# Patient Record
Sex: Male | Born: 1990 | Race: White | Hispanic: No | Marital: Single | Smoking: Current every day smoker
Health system: Southern US, Community
[De-identification: ages and names within clinical notes are randomized; demographics above are authoritative.]

---
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
## 2016-04-03 ENCOUNTER — Encounter: Payer: Self-pay | Admitting: Medical Oncology

## 2016-04-03 ENCOUNTER — Emergency Department: Payer: BLUE CROSS/BLUE SHIELD

## 2016-04-03 ENCOUNTER — Emergency Department
Admission: EM | Admit: 2016-04-03 | Discharge: 2016-04-03 | Disposition: A | Discharge status: Home / Self Care | Payer: BLUE CROSS/BLUE SHIELD | Attending: Emergency Medicine | Admitting: Emergency Medicine

## 2016-04-03 DIAGNOSIS — R1030 Lower abdominal pain, unspecified: Secondary | ICD-10-CM | POA: Diagnosis not present

## 2016-04-03 DIAGNOSIS — R109 Unspecified abdominal pain: Secondary | ICD-10-CM

## 2016-04-03 LAB — COMPREHENSIVE METABOLIC PANEL
ALBUMIN: 4.4 g/dL (ref 3.5–5.0)
ALK PHOS: 78 U/L (ref 38–126)
ALT: 17 U/L (ref 17–63)
AST: 25 U/L (ref 15–41)
Anion gap: 7 (ref 5–15)
BILIRUBIN TOTAL: 0.9 mg/dL (ref 0.3–1.2)
BUN: 10 mg/dL (ref 6–20)
CALCIUM: 8.9 mg/dL (ref 8.9–10.3)
CO2: 29 mmol/L (ref 22–32)
CREATININE: 0.85 mg/dL (ref 0.61–1.24)
Chloride: 102 mmol/L (ref 101–111)
GFR calc Af Amer: >60 mL/min (ref >60)
GLUCOSE: 91 mg/dL (ref 65–99)
Potassium: 3.5 mmol/L (ref 3.5–5.1)
Sodium: 138 mmol/L (ref 135–145)
TOTAL PROTEIN: 7.7 g/dL (ref 6.5–8.1)

## 2016-04-03 LAB — URINALYSIS, COMPLETE (UACMP) WITH MICROSCOPIC
BACTERIA UA: NONE SEEN
BILIRUBIN URINE: NEGATIVE
Glucose, UA: NEGATIVE mg/dL
Hgb urine dipstick: NEGATIVE
Ketones, ur: NEGATIVE mg/dL
LEUKOCYTES UA: NEGATIVE
NITRITE: NEGATIVE
PH: 6 (ref 5.0–8.0)
Protein, ur: NEGATIVE mg/dL
RBC / HPF: NONE SEEN RBC/hpf (ref 0–5)
SPECIFIC GRAVITY, URINE: 1.025 (ref 1.005–1.030)

## 2016-04-03 LAB — LIPASE, BLOOD: Lipase: 21 U/L (ref 11–51)

## 2016-04-03 LAB — CBC
HCT: 43.9 % (ref 40.0–52.0)
Hemoglobin: 14.9 g/dL (ref 13.0–18.0)
MCH: 27.9 pg (ref 26.0–34.0)
MCHC: 33.9 g/dL (ref 32.0–36.0)
MCV: 82.4 fL (ref 80.0–100.0)
PLATELETS: 163 10*3/uL (ref 150–440)
RBC: 5.32 MIL/uL (ref 4.40–5.90)
RDW: 13.7 % (ref 11.5–14.5)
WBC: 7.8 10*3/uL (ref 3.8–10.6)

## 2016-04-03 MED ORDER — IOPAMIDOL (ISOVUE-300) INJECTION 61%
30.0000 mL | Freq: Once | INTRAVENOUS | Status: AC
  Administered 2016-04-03: 30 mL via ORAL
  Filled 2016-04-03: qty 30

## 2016-04-03 MED ORDER — IOPAMIDOL (ISOVUE-300) INJECTION 61%
100.0000 mL | Freq: Once | INTRAVENOUS | Status: AC | PRN
  Administered 2016-04-03: 100 mL via INTRAVENOUS
  Filled 2016-04-03: qty 100

## 2016-04-03 MED ORDER — HYDROCODONE-ACETAMINOPHEN 5-325 MG PO TABS
2.0000 | ORAL_TABLET | Freq: Once | ORAL | Status: AC
  Administered 2016-04-03: 2 via ORAL

## 2016-04-03 MED ORDER — ONDANSETRON HCL 4 MG/2ML IJ SOLN
4.0000 mg | Freq: Once | INTRAMUSCULAR | Status: AC
  Administered 2016-04-03: 4 mg via INTRAVENOUS
  Filled 2016-04-03: qty 2

## 2016-04-03 MED ORDER — HYDROCODONE-ACETAMINOPHEN 5-325 MG PO TABS
ORAL_TABLET | ORAL | Status: AC
  Administered 2016-04-03: 2 via ORAL
  Filled 2016-04-03: qty 2

## 2016-04-03 MED ORDER — HYDROCODONE-ACETAMINOPHEN 5-325 MG PO TABS: 1.0000 | ORAL_TABLET | ORAL | 0 refills | Status: DC | PRN

## 2016-04-03 MED ORDER — MORPHINE SULFATE (PF) 4 MG/ML IV SOLN
4.0000 mg | Freq: Once | INTRAVENOUS | Status: AC
  Administered 2016-04-03: 4 mg via INTRAVENOUS
  Filled 2016-04-03: qty 1

## 2016-04-03 NOTE — ED Notes (Signed)
Pt to ed with c/o abd pain that began this am after getting up, denies n/v/d. Reports more painful when he stands straight up. Pt states he feels better when he is sitting up.  Pt skin warm and dry.  Pt alert and oriented and jovial.

## 2016-04-03 NOTE — ED Triage Notes (Signed)
Pt reports abd pain that began this am, denies nvd. Reports more painful when he stands straight up.

## 2016-04-03 NOTE — ED Provider Notes (Signed)
[facility] Emergency Department Provider Note  Time seen: 3:24 PM  I have reviewed the triage vital signs and the nursing notes.   HISTORY  Chief Complaint Abdominal Pain    HPI Sean Vega is a 26 y.o. male with no past medical history who presents the emergency department for abdominal pain. According to the patient since 2:30 this morning he has been experiencing lower abdominal pain. States it hurts anytime he attempts to stand straight or stretches abdomen. States it hurts if he pushes in his lower abdomen. Denies any nausea, vomiting, diarrhea. States a normal bowel movement approximately 30 minutes ago. Denies any fever. Denies any abdominal surgeries in the past.  History reviewed. No pertinent past medical history.  There are no active problems to display for this patient.   History reviewed. No pertinent surgical history.  Prior to Admission medications   Not on File    No Known Allergies  No family history on file.  Social History Social History  Substance Use Topics  . Smoking status: Not on file  . Smokeless tobacco: Not on file  . Alcohol use Not on file    Review of Systems Constitutional: Negative for fever. Cardiovascular: Negative for chest pain. Respiratory: Negative for shortness of breath. Gastrointestinal: Positive for lower abdominal pain. Negative for nausea, vomiting, diarrhea. Genitourinary: Negative for dysuria. Musculoskeletal: Negative for back pain Neurological: Negative for headache 10-point ROS otherwise negative.  ____________________________________________   PHYSICAL EXAM:  VITAL SIGNS: ED Triage Vitals [04/03/16 1329]  Enc Vitals Group     BP (!) 141/93     Pulse Rate 81     Resp 16     Temp 97.7 F (36.5 C)     Temp Source Oral     SpO2 100 %     Weight 135 lb (61.2 kg)     Height 5\' 3"  (1.6 m)     Head Circumference      Peak Flow      Pain Score 8     Pain Loc      Pain Edu?       Excl. in GC?     Constitutional: Alert and oriented. Well appearing and in no distress. Eyes: Normal exam ENT   Head: Normocephalic and atraumatic.   Mouth/Throat: Mucous membranes are moist. Cardiovascular: Normal rate, regular rhythm. No murmur Respiratory: Normal respiratory effort without tachypnea nor retractions. Breath sounds are clear  Gastrointestinal: , Fairly diffuse lower abdominal voluntary guarding on exam, mild diffuse lower abdominal tenderness to palpation. No rebound. No CVA tenderness. Musculoskeletal: Nontender with normal range of motion in all extremities.  Neurologic:  Normal speech and language. No gross focal neurologic deficits Skin:  Skin is warm, dry and intact.  Psychiatric: Mood and affect are normal.   ____________________________________________   RADIOLOGY  CT negative  ____________________________________________   INITIAL IMPRESSION / ASSESSMENT AND PLAN / ED COURSE  Pertinent labs & imaging results that were available during my care of the patient were reviewed by me and considered in my medical decision making (see chart for details).  Patient presents the emergency department for lower abdominal pain since 2:30 this morning. Patient has voluntary guarding of the lower abdomen, making palpation quite difficult. Appears to be somewhat tender in the lower abdomen. Given his guarding and complaint of increased pain with any abdominal stretching we will obtain a CT scan of the abdomen and pelvis to rule out appendicitis.  CT scan of abdomen is  negative. Labs are within normal limits. Highly suspect muscular injury. Patient will be discharged with a short course of pain medication. Patient agreeable to plan.  ____________________________________________   FINAL CLINICAL IMPRESSION(S) / ED DIAGNOSES  Abdominal pain    Minna AntisKevin Emorie Mcfate, MD 04/03/16 [phone]

## 2016-06-28 ENCOUNTER — Ambulatory Visit: Payer: Self-pay | Admitting: Family Medicine

## 2016-08-09 ENCOUNTER — Ambulatory Visit: Payer: Self-pay | Admitting: Family Medicine

## 2016-10-18 ENCOUNTER — Ambulatory Visit: Payer: Self-pay | Admitting: Family Medicine

## 2016-11-22 ENCOUNTER — Ambulatory Visit (INDEPENDENT_AMBULATORY_CARE_PROVIDER_SITE_OTHER): Payer: BLUE CROSS/BLUE SHIELD | Admitting: Family Medicine

## 2016-11-22 ENCOUNTER — Encounter: Payer: Self-pay | Admitting: Family Medicine

## 2016-11-22 VITALS — BP 129/84 | HR 59 | Temp 98.0°F | Resp 16 | Ht 63.5 in | Wt 137.0 lb

## 2016-11-22 DIAGNOSIS — Z13220 Encounter for screening for lipoid disorders: Secondary | ICD-10-CM

## 2016-11-22 DIAGNOSIS — F321 Major depressive disorder, single episode, moderate: Secondary | ICD-10-CM

## 2016-11-22 DIAGNOSIS — Z23 Encounter for immunization: Secondary | ICD-10-CM

## 2016-11-22 DIAGNOSIS — R0602 Shortness of breath: Secondary | ICD-10-CM

## 2016-11-22 DIAGNOSIS — Z113 Encounter for screening for infections with a predominantly sexual mode of transmission: Secondary | ICD-10-CM

## 2016-11-22 DIAGNOSIS — M25562 Pain in left knee: Secondary | ICD-10-CM | POA: Diagnosis not present

## 2016-11-22 DIAGNOSIS — F325 Major depressive disorder, single episode, in full remission: Secondary | ICD-10-CM | POA: Insufficient documentation

## 2016-11-22 DIAGNOSIS — M25561 Pain in right knee: Secondary | ICD-10-CM

## 2016-11-22 DIAGNOSIS — Z789 Other specified health status: Secondary | ICD-10-CM

## 2016-11-22 DIAGNOSIS — Z9189 Other specified personal risk factors, not elsewhere classified: Secondary | ICD-10-CM

## 2016-11-22 DIAGNOSIS — G8929 Other chronic pain: Secondary | ICD-10-CM

## 2016-11-22 MED ORDER — FLUOXETINE HCL 20 MG PO CAPS: 20.0000 mg | ORAL_CAPSULE | Freq: Every day | ORAL | 3 refills | Status: DC

## 2016-11-22 MED ORDER — NAPROXEN 500 MG PO TABS
500.0000 mg | ORAL_TABLET | Freq: Two times a day (BID) | ORAL | 2 refills | Status: DC
Start: 2016-11-22 — End: 2017-04-25

## 2016-11-22 NOTE — Assessment & Plan Note (Signed)
Not under good control. Will start prozac. Recheck 2 weeks. Call with any concerns.

## 2016-11-22 NOTE — Progress Notes (Signed)
BP 129/84 (BP Location: Left Arm, Patient Position: Sitting, Cuff Size: Normal)   Pulse (!) 59   Temp 98 F (36.7 C) (Oral)   Resp 16   Ht 5' 3.5" (1.613 m)   Wt 137 lb (62.1 kg)   BMI 23.89 kg/m    Subjective:    Patient ID: Sean Vega, male    DOB: [birth year], 26 y.o.   MRN: [id number]  HPI: Sean Vega is a 26 y.o. male  Chief Complaint  Patient presents with  . Establish Care    Pain in rt knee X53mos. Achy all over off and on   KNEE PAIN- happened when he was a kid, but then got better, then started to hurt again Duration: Months Involved knee: both, R>L Mechanism of injury: unknown Location:anterior Onset: sudden Severity: 7/10  Quality:  aching Frequency: intermittent, with flexion and running Radiation: no Aggravating factors: running and bending  Alleviating factors: horse linement and NSAIDs  Status: worse Treatments attempted: ibuprofen  Relief with NSAIDs?:  moderate Weakness with weight bearing or walking: no Sensation of giving way: no Locking: no Popping: yes Bruising: no Swelling: no Redness: no Paresthesias/decreased sensation: no Fevers: no   SHORTNESS OF BREATH Duration: January/February Onset: gradual Description of breathing discomfort: loud breathing, wheezing Severity: mild Episode duration: unknown Frequency: daily Related to exertion: yes Cough: no Chest tightness: no Wheezing: yes Fevers: no Chest pain: no Palpitations: no  Nausea: no Diaphoresis: yes Deconditioning: no Status: worse  DEPRESSION- started when he was in prision. Has gotten worse since he got out. Not feeling like himself.  Mood status: exacerbated Satisfied with current treatment?: no Symptom severity: moderate  Duration of current treatment : Not on anything Side effects: no Medication compliance: excellent compliance Psychotherapy/counseling: no  Previous psychiatric medications: none Depressed mood: yes Anxious mood:  no Anhedonia: no Significant weight loss or gain: no Insomnia: no  Fatigue: yes Feelings of worthlessness or guilt: yes Impaired concentration/indecisiveness: yes Suicidal ideations: no Hopelessness: no Crying spells: no Depression screen PHQ 2/9 11/22/2016  Decreased Interest 0  Down, Depressed, Hopeless 3  PHQ - 2 Score 3  Altered sleeping 0  Tired, decreased energy 3  Change in appetite 3  Feeling bad or failure about yourself  2  Trouble concentrating 3  Moving slowly or fidgety/restless 3  Suicidal thoughts 0  PHQ-9 Score 17  Difficult doing work/chores Very difficult     Active Ambulatory Problems    Diagnosis Date Noted  . Depression, major, single episode, moderate (HCC) 11/22/2016  . Arthralgia of both knees 11/22/2016   Resolved Ambulatory Problems    Diagnosis Date Noted  . Routine screening for STI (sexually transmitted infection) 11/22/2016   No Additional Past Medical History   No past surgical history on file.  Outpatient Encounter Prescriptions as of 11/22/2016  Medication Sig  . [DISCONTINUED] ibuprofen (ADVIL,MOTRIN) 200 MG tablet Take 200 mg by mouth every 8 (eight) hours as needed.  Marland Kitchen FLUoxetine (PROZAC) 20 MG capsule Take 1 capsule (20 mg total) by mouth daily.  . naproxen (NAPROSYN) 500 MG tablet Take 1 tablet (500 mg total) by mouth 2 (two) times daily with a meal.  . [DISCONTINUED] HYDROcodone-acetaminophen (NORCO/VICODIN) 5-325 MG tablet Take 1 tablet by mouth every 4 (four) hours as needed.   No facility-administered encounter medications on file as of 11/22/2016.    No Known Allergies  Family History  Problem Relation Age of Onset  . Lung disease Maternal Grandmother  Social History   Social History  . Marital status: Single    Spouse name: N/A  . Number of children: N/A  . Years of education: N/A   Occupational History  . Not on file.   Social History Main Topics  . Smoking status: Current Every Day Smoker  . Smokeless  tobacco: Never Used  . Alcohol use Not on file     Comment: 10 per week  . Drug use: No  . Sexual activity: Not on file   Other Topics Concern  . Not on file   Social History Narrative  . No narrative on file     Review of Systems  Constitutional: Negative.   Respiratory: Positive for shortness of breath. Negative for apnea, cough, choking, chest tightness, wheezing and stridor.   Cardiovascular: Negative.   Musculoskeletal: Positive for arthralgias, gait problem and joint swelling. Negative for back pain, myalgias, neck pain and neck stiffness.  Psychiatric/Behavioral: Positive for dysphoric mood. Negative for agitation, behavioral problems, confusion, decreased concentration, hallucinations, self-injury, sleep disturbance and suicidal ideas. The patient is not nervous/anxious and is not hyperactive.     Per HPI unless specifically indicated above     Objective:    BP 129/84 (BP Location: Left Arm, Patient Position: Sitting, Cuff Size: Normal)   Pulse (!) 59   Temp 98 F (36.7 C) (Oral)   Resp 16   Ht 5' 3.5" (1.613 m)   Wt 137 lb (62.1 kg)   BMI 23.89 kg/m   Wt Readings from Last 3 Encounters:  11/22/16 137 lb (62.1 kg)  04/03/16 135 lb (61.2 kg)    Physical Exam  Constitutional: He is oriented to person, place, and time. He appears well-developed and well-nourished. No distress.  HENT:  Head: Normocephalic and atraumatic.  Right Ear: Hearing normal.  Left Ear: Hearing normal.  Nose: Nose normal.  Eyes: Conjunctivae and lids are normal. Right eye exhibits no discharge. Left eye exhibits no discharge. No scleral icterus.  Cardiovascular: Normal rate, regular rhythm, normal heart sounds and intact distal pulses.  Exam reveals no gallop and no friction rub.   No murmur heard. Pulmonary/Chest: Effort normal and breath sounds normal. No respiratory distress. He has no wheezes. He has no rales. He exhibits no tenderness.  Musculoskeletal: Normal range of motion. He  exhibits tenderness (patellar tendon bilaterally). He exhibits no edema or deformity.  Negative Apply's compression and distraction, negative mcmurrays, negative lachmans bilaterally   Neurological: He is alert and oriented to person, place, and time.  Skin: Skin is warm, dry and intact. No rash noted. He is not diaphoretic. No erythema. No pallor.  Psychiatric: He has a normal mood and affect. His speech is normal and behavior is normal. Judgment and thought content normal. Cognition and memory are normal.  Nursing note and vitals reviewed.   Results for orders placed or performed during the hospital encounter of 04/03/16  Lipase, blood  Result Value Ref Range   Lipase 21 11 - 51 U/L  Comprehensive metabolic panel  Result Value Ref Range   Sodium 138 135 - 145 mmol/L   Potassium 3.5 3.5 - 5.1 mmol/L   Chloride 102 101 - 111 mmol/L   CO2 29 22 - 32 mmol/L   Glucose, Bld 91 65 - 99 mg/dL   BUN 10 6 - 20 mg/dL   Creatinine, Ser 1.19 0.61 - 1.24 mg/dL   Calcium 8.9 8.9 - 41.7 mg/dL   Total Protein 7.7 6.5 - 8.1 g/dL   Albumin  4.4 3.5 - 5.0 g/dL   AST 25 15 - 41 U/L   ALT 17 17 - 63 U/L   Alkaline Phosphatase 78 38 - 126 U/L   Total Bilirubin 0.9 0.3 - 1.2 mg/dL   GFR calc non Af Amer >60 >60 mL/min   GFR calc Af Amer >60 >60 mL/min   Anion gap 7 5 - 15  CBC  Result Value Ref Range   WBC 7.8 3.8 - 10.6 K/uL   RBC 5.32 4.40 - 5.90 MIL/uL   Hemoglobin 14.9 13.0 - 18.0 g/dL   HCT 16.1 09.6 - 04.5 %   MCV 82.4 80.0 - 100.0 fL   MCH 27.9 26.0 - 34.0 pg   MCHC 33.9 32.0 - 36.0 g/dL   RDW 40.9 81.1 - 91.4 %   Platelets 163 150 - 440 K/uL  Urinalysis, Complete w Microscopic  Result Value Ref Range   Color, Urine YELLOW (A) YELLOW   APPearance CLEAR (A) CLEAR   Specific Gravity, Urine 1.025 1.005 - 1.030   pH 6.0 5.0 - 8.0   Glucose, UA NEGATIVE NEGATIVE mg/dL   Hgb urine dipstick NEGATIVE NEGATIVE   Bilirubin Urine NEGATIVE NEGATIVE   Ketones, ur NEGATIVE NEGATIVE mg/dL    Protein, ur NEGATIVE NEGATIVE mg/dL   Nitrite NEGATIVE NEGATIVE   Leukocytes, UA NEGATIVE NEGATIVE   RBC / HPF NONE SEEN 0 - 5 RBC/hpf   WBC, UA 0-5 0 - 5 WBC/hpf   Bacteria, UA NONE SEEN NONE SEEN   Squamous Epithelial / LPF 0-5 (A) NONE SEEN   Mucus PRESENT       Assessment & Plan:   Problem List Items Addressed This Visit      Other   Depression, major, single episode, moderate (HCC)    Not under good control. Will start prozac. Recheck 2 weeks. Call with any concerns.       Relevant Medications   FLUoxetine (PROZAC) 20 MG capsule   Other Relevant Orders   CBC with Differential/Platelet   Comprehensive metabolic panel   TSH   Arthralgia of both knees    Likely jumper's knee. Rx for meloxicam given. Exercises given. Will check x-ray of R knee given severity. Call with any concerns. Recheck 1 month.       Relevant Orders   Comprehensive metabolic panel   Uric acid   Lyme Ab/Western Blot Reflex   Rocky mtn spotted fvr abs pnl(IgG+IgM)   Ehrlichia Antibody Panel   Babesia microti Antibody Panel   RESOLVED: Routine screening for STI (sexually transmitted infection)    Labs drawn today. Await results.       Relevant Orders   UA/M w/rflx Culture, Routine   HIV antibody   RPR   HSV(herpes simplex vrs) 1+2 ab-IgG   GC/Chlamydia Probe Amp   Hepatitis, Acute    Other Visit Diagnoses    SOB (shortness of breath)    -  Primary   Spirometry normal. Not feeling particulatrly bad. Reassured patient. Continue to monitor.    Relevant Orders   CBC with Differential/Platelet   Comprehensive metabolic panel   Spirometry with Graph (Completed)   Need for Tdap vaccination       Vaccine given today.    History of incarceration       Will check quantiferon and HIV. Ordered today.   Relevant Orders   Quantiferon tb gold assay (blood)   Screening for cholesterol level       Labs drawn today. Await results.  Relevant Orders   Lipid Panel w/o Chol/HDL Ratio   Chronic pain of  right knee       Likely jumper's knee. Will obtain x-ray. Order placed today.   Relevant Orders   DG Knee Complete 4 Views Right       Follow up plan: Return in about 2 weeks (around 12/06/2016) for follow up knees and mood.

## 2016-11-22 NOTE — Assessment & Plan Note (Signed)
Likely jumper's knee. Rx for meloxicam given. Exercises given. Will check x-ray of R knee given severity. Call with any concerns. Recheck 1 month.

## 2016-11-22 NOTE — Patient Instructions (Addendum)
Tdap Vaccine (Tetanus, Diphtheria and Pertussis): What You Need to Know 1. Why get vaccinated? Tetanus, diphtheria and pertussis are very serious diseases. Tdap vaccine can protect us from these diseases. And, Tdap vaccine given to pregnant women can protect newborn babies against pertussis. TETANUS (Lockjaw) is rare in the [place] today. It causes painful muscle tightening and stiffness, usually all over the body.  It can lead to tightening of muscles in the head and neck so you can't open your mouth, swallow, or sometimes even breathe. Tetanus kills about 1 out of 10 people who are infected even after receiving the best medical care.  DIPHTHERIA is also rare in the [place] today. It can cause a thick coating to form in the back of the throat.  It can lead to breathing problems, heart failure, paralysis, and death.  PERTUSSIS (Whooping Cough) causes severe coughing spells, which can cause difficulty breathing, vomiting and disturbed sleep.  It can also lead to weight loss, incontinence, and rib fractures. Up to 2 in 100 adolescents and 5 in 100 adults with pertussis are hospitalized or have complications, which could include pneumonia or death.  These diseases are caused by bacteria. Diphtheria and pertussis are spread from person to person through secretions from coughing or sneezing. Tetanus enters the body through cuts, scratches, or wounds. Before vaccines, as many as 200,000 cases of diphtheria, 200,000 cases of pertussis, and hundreds of cases of tetanus, were reported in the [place] each year. Since vaccination began, reports of cases for tetanus and diphtheria have dropped by about 99% and for pertussis by about 80%. 2. Tdap vaccine Tdap vaccine can protect adolescents and adults from tetanus, diphtheria, and pertussis. One dose of Tdap is routinely given at age 11 or 12. People who did not get Tdap at that age should get it as soon as possible. Tdap is especially  important for healthcare professionals and anyone having close contact with a baby younger than 12 months. Pregnant women should get a dose of Tdap during every pregnancy, to protect the newborn from pertussis. Infants are most at risk for severe, life-threatening complications from pertussis. Another vaccine, called Td, protects against tetanus and diphtheria, but not pertussis. A Td booster should be given every 10 years. Tdap may be given as one of these boosters if you have never gotten Tdap before. Tdap may also be given after a severe cut or burn to prevent tetanus infection. Your doctor or the person giving you the vaccine can give you more information. Tdap may safely be given at the same time as other vaccines. 3. Some people should not get this vaccine  A person who has ever had a life-threatening allergic reaction after a previous dose of any diphtheria, tetanus or pertussis containing vaccine, OR has a severe allergy to any part of this vaccine, should not get Tdap vaccine. Tell the person giving the vaccine about any severe allergies.  Anyone who had coma or long repeated seizures within 7 days after a childhood dose of DTP or DTaP, or a previous dose of Tdap, should not get Tdap, unless a cause other than the vaccine was found. They can still get Td.  Talk to your doctor if you: ? have seizures or another nervous system problem, ? had severe pain or swelling after any vaccine containing diphtheria, tetanus or pertussis, ? ever had a condition called Guillain-Barr Syndrome (GBS), ? aren't feeling well on the day the shot is scheduled. 4. Risks With any medicine, including   vaccines, there is a chance of side effects. These are usually mild and go away on their own. Serious reactions are also possible but are rare. Most people who get Tdap vaccine do not have any problems with it. Mild problems following Tdap: (Did not interfere with activities)  Pain where the shot was given (about  3 in 4 adolescents or 2 in 3 adults)  Redness or swelling where the shot was given (about 1 person in 5)  Mild fever of at least 100.4F (up to about 1 in 25 adolescents or 1 in 100 adults)  Headache (about 3 or 4 people in 10)  Tiredness (about 1 person in 3 or 4)  Nausea, vomiting, diarrhea, stomach ache (up to 1 in 4 adolescents or 1 in 10 adults)  Chills, sore joints (about 1 person in 10)  Body aches (about 1 person in 3 or 4)  Rash, swollen glands (uncommon)  Moderate problems following Tdap: (Interfered with activities, but did not require medical attention)  Pain where the shot was given (up to 1 in 5 or 6)  Redness or swelling where the shot was given (up to about 1 in 16 adolescents or 1 in 12 adults)  Fever over 102F (about 1 in 100 adolescents or 1 in 250 adults)  Headache (about 1 in 7 adolescents or 1 in 10 adults)  Nausea, vomiting, diarrhea, stomach ache (up to 1 or 3 people in 100)  Swelling of the entire arm where the shot was given (up to about 1 in 500).  Severe problems following Tdap: (Unable to perform usual activities; required medical attention)  Swelling, severe pain, bleeding and redness in the arm where the shot was given (rare).  Problems that could happen after any vaccine:  People sometimes faint after a medical procedure, including vaccination. Sitting or lying down for about 15 minutes can help prevent fainting, and injuries caused by a fall. Tell your doctor if you feel dizzy, or have vision changes or ringing in the ears.  Some people get severe pain in the shoulder and have difficulty moving the arm where a shot was given. This happens very rarely.  Any medication can cause a severe allergic reaction. Such reactions from a vaccine are very rare, estimated at fewer than 1 in a million doses, and would happen within a few minutes to a few hours after the vaccination. As with any medicine, there is a very remote chance of a vaccine  causing a serious injury or death. The safety of vaccines is always being monitored. For more information, visit: www.cdc.gov/vaccinesafety/ 5. What if there is a serious problem? What should I look for? Look for anything that concerns you, such as signs of a severe allergic reaction, very high fever, or unusual behavior. Signs of a severe allergic reaction can include hives, swelling of the face and throat, difficulty breathing, a fast heartbeat, dizziness, and weakness. These would usually start a few minutes to a few hours after the vaccination. What should I do?  If you think it is a severe allergic reaction or other emergency that can't wait, call 9-1-1 or get the person to the nearest hospital. Otherwise, call your doctor.  Afterward, the reaction should be reported to the Vaccine Adverse Event Reporting System (VAERS). Your doctor might file this report, or you can do it yourself through the VAERS web site at www.vaers.hhs.gov, or by calling 1-800-822-7967. ? VAERS does not give medical advice. 6. The National Vaccine Injury Compensation Program The National   Vaccine Injury Compensation Program (VICP) is a federal program that was created to compensate people who may have been injured by certain vaccines. Persons who believe they may have been injured by a vaccine can learn about the program and about filing a claim by calling 1-800-338-2382 or visiting the VICP website at www.hrsa.gov/vaccinecompensation. There is a time limit to file a claim for compensation. 7. How can I learn more?  Ask your doctor. He or she can give you the vaccine package insert or suggest other sources of information.  Call your local or state health department.  Contact the Centers for Disease Control and Prevention (CDC): ? Call 1-800-232-4636 (1-800-CDC-INFO) or ? Visit CDC's website at www.cdc.gov/vaccines CDC Tdap Vaccine VIS (05/26/13) This information is not intended to replace advice given to you by your  health care provider. Make sure you discuss any questions you have with your health care provider. Document Released: 09/18/2011 Document Revised: 12/08/2015 Document Reviewed: 12/08/2015 Elsevier Interactive Patient Education  2017 [employer]  

## 2016-11-22 NOTE — Assessment & Plan Note (Signed)
Labs drawn today. Await results.  

## 2016-11-27 ENCOUNTER — Other Ambulatory Visit: Payer: BLUE CROSS/BLUE SHIELD

## 2016-11-28 ENCOUNTER — Telehealth: Payer: Self-pay | Admitting: Family Medicine

## 2016-11-28 DIAGNOSIS — Z13220 Encounter for screening for lipoid disorders: Secondary | ICD-10-CM

## 2016-11-28 DIAGNOSIS — M25561 Pain in right knee: Secondary | ICD-10-CM

## 2016-11-28 DIAGNOSIS — R0602 Shortness of breath: Secondary | ICD-10-CM

## 2016-11-28 DIAGNOSIS — M25562 Pain in left knee: Secondary | ICD-10-CM

## 2016-11-28 DIAGNOSIS — Z789 Other specified health status: Secondary | ICD-10-CM

## 2016-11-28 DIAGNOSIS — F321 Major depressive disorder, single episode, moderate: Secondary | ICD-10-CM

## 2016-11-28 DIAGNOSIS — Z113 Encounter for screening for infections with a predominantly sexual mode of transmission: Secondary | ICD-10-CM

## 2016-11-28 NOTE — Telephone Encounter (Signed)
Patient has already been notified that he needs to have his blood redrawn.

## 2016-11-28 NOTE — Telephone Encounter (Signed)
[facility] has no record of his labs. Please have him come back in to have them redrawn. Orders replaced today.

## 2016-12-06 ENCOUNTER — Ambulatory Visit (INDEPENDENT_AMBULATORY_CARE_PROVIDER_SITE_OTHER): Payer: BLUE CROSS/BLUE SHIELD | Admitting: Family Medicine

## 2016-12-06 ENCOUNTER — Encounter: Payer: Self-pay | Admitting: Family Medicine

## 2016-12-06 VITALS — BP 127/82 | HR 60 | Temp 97.6°F | Wt 132.5 lb

## 2016-12-06 DIAGNOSIS — Z9189 Other specified personal risk factors, not elsewhere classified: Secondary | ICD-10-CM

## 2016-12-06 DIAGNOSIS — M25561 Pain in right knee: Secondary | ICD-10-CM

## 2016-12-06 DIAGNOSIS — Z23 Encounter for immunization: Secondary | ICD-10-CM | POA: Diagnosis not present

## 2016-12-06 DIAGNOSIS — M25562 Pain in left knee: Secondary | ICD-10-CM

## 2016-12-06 DIAGNOSIS — R0602 Shortness of breath: Secondary | ICD-10-CM | POA: Diagnosis not present

## 2016-12-06 DIAGNOSIS — F321 Major depressive disorder, single episode, moderate: Secondary | ICD-10-CM

## 2016-12-06 DIAGNOSIS — Z113 Encounter for screening for infections with a predominantly sexual mode of transmission: Secondary | ICD-10-CM | POA: Diagnosis not present

## 2016-12-06 DIAGNOSIS — Z789 Other specified health status: Secondary | ICD-10-CM

## 2016-12-06 DIAGNOSIS — Z13220 Encounter for screening for lipoid disorders: Secondary | ICD-10-CM | POA: Diagnosis not present

## 2016-12-06 LAB — UA/M W/RFLX CULTURE, ROUTINE
BILIRUBIN UA: NEGATIVE
Glucose, UA: NEGATIVE
KETONES UA: NEGATIVE
LEUKOCYTES UA: NEGATIVE
NITRITE UA: NEGATIVE
PH UA: 5.5 (ref 5.0–7.5)
Protein, UA: NEGATIVE
RBC UA: NEGATIVE
SPEC GRAV UA: 1.025 (ref 1.005–1.030)
Urobilinogen, Ur: 0.2 mg/dL (ref 0.2–1.0)

## 2016-12-06 LAB — MICROSCOPIC EXAMINATION
BACTERIA UA: NONE SEEN
RBC, UA: NONE SEEN /hpf (ref 0–?)

## 2016-12-06 NOTE — Progress Notes (Signed)
BP 127/82 (BP Location: Left Arm, Patient Position: Sitting, Cuff Size: Normal)   Pulse 60   Temp 97.6 F (36.4 C)   Wt 132 lb 8 oz (60.1 kg)   SpO2 100%   BMI 23.10 kg/m    Subjective:    Patient ID: Sean Vega, male    DOB: 28-Aug-1990, 26 y.o.   MRN: [id number]  HPI: Sean Vega is a 26 y.o. male  Chief Complaint  Patient presents with  . Depression  . Knee Pain    Right   Did not have any of his blood work done last visit. Results currently not available because of that- will get them drawn today.  DEPRESSION- states that he's doing better. Enjoying playing basketball a bit more. Feeling a bit more like himself. No funny side effects.  Mood status: stable Satisfied with current treatment?: yes Symptom severity: moderate  Duration of current treatment : 2 weeks Side effects: no Medication compliance: excellent compliance Psychotherapy/counseling: no  Previous psychiatric medications: prozac Depressed mood: yes Anxious mood: yes Anhedonia: no Significant weight loss or gain: no Insomnia: yes hard to fall asleep Fatigue: yes Feelings of worthlessness or guilt: yes Impaired concentration/indecisiveness: yes Suicidal ideations: no Hopelessness: no Crying spells: no Depression screen [facility] 2/9 12/06/2016 11/22/2016  Decreased Interest 3 0  Down, Depressed, Hopeless 2 3  PHQ - 2 Score 5 3  Altered sleeping 3 0  Tired, decreased energy 2 3  Change in appetite 3 3  Feeling bad or failure about yourself  1 2  Trouble concentrating 3 3  Moving slowly or fidgety/restless 1 3  Suicidal thoughts 0 0  PHQ-9 Score 18 17  Difficult doing work/chores Very difficult Very difficult   Knee doing a bit better with exercises. Medicines help. No other concerns.   Relevant past medical, surgical, family and social history reviewed and updated as indicated. Interim medical history since our last visit reviewed. Allergies and medications reviewed and  updated.  Review of Systems  Constitutional: Negative.   Respiratory: Negative.   Cardiovascular: Negative.   Psychiatric/Behavioral: Positive for dysphoric mood. Negative for agitation, behavioral problems, confusion, decreased concentration, hallucinations, self-injury, sleep disturbance and suicidal ideas. The patient is nervous/anxious. The patient is not hyperactive.     Per HPI unless specifically indicated above     Objective:    BP 127/82 (BP Location: Left Arm, Patient Position: Sitting, Cuff Size: Normal)   Pulse 60   Temp 97.6 F (36.4 C)   Wt 132 lb 8 oz (60.1 kg)   SpO2 100%   BMI 23.10 kg/m   Wt Readings from Last 3 Encounters:  12/06/16 132 lb 8 oz (60.1 kg)  11/22/16 137 lb (62.1 kg)  04/03/16 135 lb (61.2 kg)    Physical Exam  Constitutional: He is oriented to person, place, and time. He appears well-developed and well-nourished. No distress.  HENT:  Head: Normocephalic and atraumatic.  Right Ear: Hearing normal.  Left Ear: Hearing normal.  Nose: Nose normal.  Eyes: Conjunctivae and lids are normal. Right eye exhibits no discharge. Left eye exhibits no discharge. No scleral icterus.  Cardiovascular: Normal rate, regular rhythm, normal heart sounds and intact distal pulses.  Exam reveals no gallop and no friction rub.   No murmur heard. Pulmonary/Chest: Effort normal and breath sounds normal. No respiratory distress. He has no wheezes. He has no rales. He exhibits no tenderness.  Musculoskeletal: Normal range of motion.  Neurological: He is alert and oriented  to person, place, and time.  Skin: Skin is warm, dry and intact. No rash noted. He is not diaphoretic. No erythema. No pallor.  Psychiatric: He has a normal mood and affect. His speech is normal and behavior is normal. Judgment and thought content normal. Cognition and memory are normal.  Nursing note and vitals reviewed.   Results for orders placed or performed during the hospital encounter of  04/03/16  Lipase, blood  Result Value Ref Range   Lipase 21 11 - 51 U/L  Comprehensive metabolic panel  Result Value Ref Range   Sodium 138 135 - 145 mmol/L   Potassium 3.5 3.5 - 5.1 mmol/L   Chloride 102 101 - 111 mmol/L   CO2 29 22 - 32 mmol/L   Glucose, Bld 91 65 - 99 mg/dL   BUN 10 6 - 20 mg/dL   Creatinine, Ser 1.61 0.61 - 1.24 mg/dL   Calcium 8.9 8.9 - 09.6 mg/dL   Total Protein 7.7 6.5 - 8.1 g/dL   Albumin 4.4 3.5 - 5.0 g/dL   AST 25 15 - 41 U/L   ALT 17 17 - 63 U/L   Alkaline Phosphatase 78 38 - 126 U/L   Total Bilirubin 0.9 0.3 - 1.2 mg/dL   GFR calc non Af Amer >60 >60 mL/min   GFR calc Af Amer >60 >60 mL/min   Anion gap 7 5 - 15  CBC  Result Value Ref Range   WBC 7.8 3.8 - 10.6 K/uL   RBC 5.32 4.40 - 5.90 MIL/uL   Hemoglobin 14.9 13.0 - 18.0 g/dL   HCT 04.5 40.9 - 81.1 %   MCV 82.4 80.0 - 100.0 fL   MCH 27.9 26.0 - 34.0 pg   MCHC 33.9 32.0 - 36.0 g/dL   RDW 91.4 78.2 - 95.6 %   Platelets 163 150 - 440 K/uL  Urinalysis, Complete w Microscopic  Result Value Ref Range   Color, Urine YELLOW (A) YELLOW   APPearance CLEAR (A) CLEAR   Specific Gravity, Urine 1.025 1.005 - 1.030   pH 6.0 5.0 - 8.0   Glucose, UA NEGATIVE NEGATIVE mg/dL   Hgb urine dipstick NEGATIVE NEGATIVE   Bilirubin Urine NEGATIVE NEGATIVE   Ketones, ur NEGATIVE NEGATIVE mg/dL   Protein, ur NEGATIVE NEGATIVE mg/dL   Nitrite NEGATIVE NEGATIVE   Leukocytes, UA NEGATIVE NEGATIVE   RBC / HPF NONE SEEN 0 - 5 RBC/hpf   WBC, UA 0-5 0 - 5 WBC/hpf   Bacteria, UA NONE SEEN NONE SEEN   Squamous Epithelial / LPF 0-5 (A) NONE SEEN   Mucus PRESENT       Assessment & Plan:   Problem List Items Addressed This Visit      Other   Depression, major, single episode, moderate (HCC) - Primary    Stable. No side effects from his fluoxetine. Recheck 2-3 weeks and adjust dose as needed.       Arthralgia of both knees    Did not have labs drawn last visit. Drawn today. Doing a bit better with exercises and  naproxen. Does not want to do PT right now. Continue to monitor.        Other Visit Diagnoses    History of incarceration       Did not have labs drawn last visit. Drawn today.   SOB (shortness of breath)       Did not have labs drawn last visit. Drawn today.   Routine screening for STI (sexually transmitted infection)  Did not have labs drawn last visit. Drawn today.   Screening for cholesterol level       Did not have labs drawn last visit. Drawn today.   Immunization due       Flu shot given today.   Relevant Orders   Flu Vaccine QUAD 6+ mos PF IM (Fluarix Quad PF) (Completed)       Follow up plan: Return 2-3 weeks, for follow up mood.

## 2016-12-06 NOTE — Assessment & Plan Note (Signed)
Stable. No side effects from his fluoxetine. Recheck 2-3 weeks and adjust dose as needed.

## 2016-12-06 NOTE — Assessment & Plan Note (Signed)
Did not have labs drawn last visit. Drawn today. Doing a bit better with exercises and naproxen. Does not want to do PT right now. Continue to monitor.

## 2016-12-06 NOTE — Patient Instructions (Addendum)

## 2016-12-07 LAB — GC/CHLAMYDIA PROBE AMP
CHLAMYDIA, DNA PROBE: NEGATIVE
NEISSERIA GONORRHOEAE BY PCR: NEGATIVE

## 2016-12-10 ENCOUNTER — Telehealth: Payer: Self-pay | Admitting: Family Medicine

## 2016-12-10 LAB — EHRLICHIA ANTIBODY PANEL
E. Chaffeensis (HME) IgM Titer: NEGATIVE
E.Chaffeensis (HME) IgG: NEGATIVE
HGE IGG TITER: NEGATIVE
HGE IGM TITER: NEGATIVE

## 2016-12-10 LAB — COMPREHENSIVE METABOLIC PANEL
ALBUMIN: 4.5 g/dL (ref 3.5–5.5)
ALT: 14 IU/L (ref 0–44)
AST: 26 IU/L (ref 0–40)
Albumin/Globulin Ratio: 1.9 (ref 1.2–2.2)
Alkaline Phosphatase: 103 IU/L (ref 39–117)
BILIRUBIN TOTAL: 0.7 mg/dL (ref 0.0–1.2)
BUN / CREAT RATIO: 21 — AB (ref 9–20)
BUN: 17 mg/dL (ref 6–20)
CO2: 19 mmol/L — ABNORMAL LOW (ref 20–29)
Calcium: 9.3 mg/dL (ref 8.7–10.2)
Chloride: 106 mmol/L (ref 96–106)
Creatinine, Ser: 0.8 mg/dL (ref 0.76–1.27)
GFR calc non Af Amer: 125 mL/min/{1.73_m2} (ref >59)
GFR, EST AFRICAN AMERICAN: 145 mL/min/{1.73_m2} (ref >59)
Globulin, Total: 2.4 g/dL (ref 1.5–4.5)
Glucose: 83 mg/dL (ref 65–99)
POTASSIUM: 4.2 mmol/L (ref 3.5–5.2)
Sodium: 141 mmol/L (ref 134–144)
TOTAL PROTEIN: 6.9 g/dL (ref 6.0–8.5)

## 2016-12-10 LAB — QUANTIFERON IN TUBE
QFT TB AG MINUS NIL VALUE: 0 [IU]/mL
QUANTIFERON TB AG VALUE: 0.04 [IU]/mL
QUANTIFERON TB GOLD: NEGATIVE
Quantiferon Nil Value: 0.04 IU/mL

## 2016-12-10 LAB — CBC WITH DIFFERENTIAL/PLATELET
BASOS: 1 %
Basophils Absolute: 0 10*3/uL (ref 0.0–0.2)
EOS (ABSOLUTE): 0.5 10*3/uL — AB (ref 0.0–0.4)
Eos: 8 %
HEMOGLOBIN: 14.8 g/dL (ref 13.0–17.7)
Hematocrit: 47 % (ref 37.5–51.0)
IMMATURE GRANS (ABS): 0 10*3/uL (ref 0.0–0.1)
Immature Granulocytes: 0 %
LYMPHS ABS: 2.2 10*3/uL (ref 0.7–3.1)
LYMPHS: 35 %
MCH: 27.9 pg (ref 26.6–33.0)
MCHC: 31.5 g/dL (ref 31.5–35.7)
MCV: 89 fL (ref 79–97)
Monocytes Absolute: 0.4 10*3/uL (ref 0.1–0.9)
Monocytes: 7 %
NEUTROS ABS: 3.1 10*3/uL (ref 1.4–7.0)
Neutrophils: 49 %
PLATELETS: 180 10*3/uL (ref 150–379)
RBC: 5.3 x10E6/uL (ref 4.14–5.80)
RDW: 14.2 % (ref 12.3–15.4)
WBC: 6.3 10*3/uL (ref 3.4–10.8)

## 2016-12-10 LAB — HEPATITIS PANEL, ACUTE
HEP A IGM: NEGATIVE
HEP B C IGM: NEGATIVE
HEP B S AG: NEGATIVE
Hep C Virus Ab: <0.1 s/co ratio (ref 0.0–0.9)

## 2016-12-10 LAB — LYME AB/WESTERN BLOT REFLEX: Lyme IgG/IgM Ab: <0.91 {ISR} (ref 0.00–0.90)

## 2016-12-10 LAB — LIPID PANEL W/O CHOL/HDL RATIO
Cholesterol, Total: 130 mg/dL (ref 100–199)
HDL: 44 mg/dL (ref >39)
LDL CALC: 67 mg/dL (ref 0–99)
Triglycerides: 96 mg/dL (ref 0–149)
VLDL CHOLESTEROL CAL: 19 mg/dL (ref 5–40)

## 2016-12-10 LAB — RPR: RPR Ser Ql: NONREACTIVE

## 2016-12-10 LAB — HIV ANTIBODY (ROUTINE TESTING W REFLEX): HIV Screen 4th Generation wRfx: NONREACTIVE

## 2016-12-10 LAB — ROCKY MTN SPOTTED FVR ABS PNL(IGG+IGM)
RMSF IGG: POSITIVE — AB
RMSF IGM: 0.43 {index} (ref 0.00–0.89)

## 2016-12-10 LAB — BABESIA MICROTI ANTIBODY PANEL
Babesia microti IgG: <1:10 {titer}
Babesia microti IgM: <1:10 {titer}

## 2016-12-10 LAB — RMSF, IGG, IFA: RMSF, IGG, IFA: 1:64 {titer} — ABNORMAL HIGH

## 2016-12-10 LAB — HSV(HERPES SIMPLEX VRS) I + II AB-IGG

## 2016-12-10 LAB — TSH: TSH: 1.06 u[IU]/mL (ref 0.450–4.500)

## 2016-12-10 LAB — URIC ACID: Uric Acid: 4.6 mg/dL (ref 3.7–8.6)

## 2016-12-10 LAB — QUANTIFERON TB GOLD ASSAY (BLOOD)

## 2016-12-10 NOTE — Telephone Encounter (Signed)
Please let him know that the rest of his labs came back normal. He has been exposed to RMSF in the past, but it doesn't look like he has it now. Thanks!

## 2016-12-10 NOTE — Telephone Encounter (Signed)
Please let him know that his labs came back normal so far- still waiting on 1 tick disease and his TB test, but otherwise all his STD screening and regular blood work looked nice and normal. Thanks!

## 2016-12-10 NOTE — Telephone Encounter (Signed)
Patient notified

## 2016-12-11 ENCOUNTER — Telehealth: Payer: Self-pay | Admitting: Family Medicine

## 2016-12-11 NOTE — Telephone Encounter (Signed)
Patient notified via voicemail.

## 2016-12-11 NOTE — Telephone Encounter (Signed)
Spoke with patient, let him know about RMSF.

## 2016-12-20 ENCOUNTER — Ambulatory Visit (INDEPENDENT_AMBULATORY_CARE_PROVIDER_SITE_OTHER): Payer: BLUE CROSS/BLUE SHIELD | Admitting: Family Medicine

## 2016-12-20 ENCOUNTER — Encounter: Payer: Self-pay | Admitting: Family Medicine

## 2016-12-20 VITALS — BP 121/77 | HR 63 | Temp 97.7°F | Wt 135.6 lb

## 2016-12-20 DIAGNOSIS — M545 Low back pain, unspecified: Secondary | ICD-10-CM

## 2016-12-20 DIAGNOSIS — F321 Major depressive disorder, single episode, moderate: Secondary | ICD-10-CM | POA: Diagnosis not present

## 2016-12-20 MED ORDER — FLUOXETINE HCL 40 MG PO CAPS: 40.0000 mg | ORAL_CAPSULE | Freq: Every day | ORAL | 3 refills | Status: DC

## 2016-12-20 NOTE — Patient Instructions (Addendum)

## 2016-12-20 NOTE — Assessment & Plan Note (Signed)
Not under good control. Will increase his prozac to  and recheck in 1 month.

## 2016-12-20 NOTE — Progress Notes (Signed)
BP 121/77   Pulse 63   Temp 97.7 F (36.5 C)   Wt 135 lb 9.6 oz (61.5 kg)   SpO2 100%   BMI 23.64 kg/m    Subjective:    Patient ID: Sean Vega, male    DOB: [birth year], 26 y.o.   MRN: [id number]  HPI: Sean Vega is a 26 y.o. male  Chief Complaint  Patient presents with  . Depression    2 to 3 week f/up    DEPRESSION Mood status: uncontrolled Satisfied with current treatment?: no Symptom severity: moderate  Duration of current treatment : 1 month Side effects: no Medication compliance: excellent compliance Psychotherapy/counseling: no  Previous psychiatric medications: prozac Depressed mood: yes Anxious mood: yes Anhedonia: no Significant weight loss or gain: no Insomnia: yes  Fatigue: yes Feelings of worthlessness or guilt: yes Impaired concentration/indecisiveness: yes Suicidal ideations: no Hopelessness: no Crying spells: yes Depression screen [facility] 2/9 12/20/2016 12/06/2016 11/22/2016  Decreased Interest 3 3 0  Down, Depressed, Hopeless PHQ - 2 Score Altered sleeping 2 3 0  Tired, decreased energy Change in appetite Feeling bad or failure about yourself  Trouble concentrating Moving slowly or fidgety/restless Suicidal thoughts 0 0 0  PHQ-9 Score Difficult doing work/chores Very difficult Very difficult Very difficult   Back has been acting up. Feeling sore. Has been stretching, but it doesn't seem like it's helping. Notes that it feels like it needs to pop. Better with stretching, worse with being on his feet. No numbness or tingling. Has been going on about a week. No other concerns or complaints at this time.   Relevant past medical, surgical, family and social history reviewed and updated as indicated. Interim medical history since our last visit reviewed. Allergies and medications reviewed and updated.  Review of Systems  Constitutional: Negative.   Respiratory: Negative.    Cardiovascular: Negative.   Musculoskeletal: Positive for back pain and myalgias. Negative for arthralgias, gait problem, joint swelling, neck pain and neck stiffness.  Psychiatric/Behavioral: Negative.     Per HPI unless specifically indicated above     Objective:    BP 121/77   Pulse 63   Temp 97.7 F (36.5 C)   Wt 135 lb 9.6 oz (61.5 kg)   SpO2 100%   BMI 23.64 kg/m   Wt Readings from Last 3 Encounters:  12/20/16 135 lb 9.6 oz (61.5 kg)  12/06/16 132 lb 8 oz (60.1 kg)  11/22/16 137 lb (62.1 kg)    Physical Exam  Constitutional: He is oriented to person, place, and time. He appears well-developed and well-nourished. No distress.  HENT:  Head: Normocephalic and atraumatic.  Right Ear: Hearing normal.  Left Ear: Hearing normal.  Nose: Nose normal.  Eyes: Conjunctivae and lids are normal. Right eye exhibits no discharge. Left eye exhibits no discharge. No scleral icterus.  Cardiovascular: Normal rate, regular rhythm, normal heart sounds and intact distal pulses.  Exam reveals no gallop and no friction rub.   No murmur heard. Pulmonary/Chest: Effort normal and breath sounds normal. No respiratory distress. He has no wheezes. He has no rales. He exhibits no tenderness.  Musculoskeletal: Normal range of motion.  Neurological: He is alert and oriented to person, place, and time.  Skin: Skin is warm, dry and intact. No rash noted. He  is not diaphoretic. No erythema. No pallor.  Psychiatric: He has a normal mood and affect. His speech is normal and behavior is normal. Judgment and thought content normal. Cognition and memory are normal.  Nursing note and vitals reviewed.   Results for orders placed or performed in visit on 12/06/16  GC/Chlamydia Probe Amp  Result Value Ref Range   Chlamydia trachomatis, NAA Negative Negative   Neisseria gonorrhoeae by PCR Negative Negative  Microscopic Examination  Result Value Ref Range   WBC, UA 0-5 0 - 5 /hpf   RBC, UA None seen 0 - 2  /hpf   Epithelial Cells (non renal) 0-10 0 - 10 /hpf   Bacteria, UA None seen None seen/Few  Quantiferon tb gold assay (blood)  Result Value Ref Range   QUANTIFERON INCUBATION Comment   CBC with Differential/Platelet  Result Value Ref Range   WBC 6.3 3.4 - 10.8 x10E3/uL   RBC 5.30 4.14 - 5.80 x10E6/uL   Hemoglobin 14.8 13.0 - 17.7 g/dL   Hematocrit 11.9 14.7 - 51.0 %   MCV 89 79 - 97 fL   MCH 27.9 26.6 - 33.0 pg   MCHC 31.5 31.5 - 35.7 g/dL   RDW 82.9 56.2 - 13.0 %   Platelets 180 150 - 379 x10E3/uL   Neutrophils 49 Not Estab. %   Lymphs 35 Not Estab. %   Monocytes 7 Not Estab. %   Eos 8 Not Estab. %   Basos 1 Not Estab. %   Neutrophils Absolute 3.1 1.4 - 7.0 x10E3/uL   Lymphocytes Absolute 2.2 0.7 - 3.1 x10E3/uL   Monocytes Absolute 0.4 0.1 - 0.9 x10E3/uL   EOS (ABSOLUTE) 0.5 (H) 0.0 - 0.4 x10E3/uL   Basophils Absolute 0.0 0.0 - 0.2 x10E3/uL   Immature Granulocytes 0 Not Estab. %   Immature Grans (Abs) 0.0 0.0 - 0.1 x10E3/uL  Comprehensive metabolic panel  Result Value Ref Range   Glucose 83 65 - 99 mg/dL   BUN 17 6 - 20 mg/dL   Creatinine, Ser 8.65 0.76 - 1.27 mg/dL   GFR calc non Af Amer 125 >59 mL/min/1.73   GFR calc Af Amer 145 >59 mL/min/1.73   BUN/Creatinine Ratio 21 (H) 9 - 20   Sodium 141 134 - 144 mmol/L   Potassium 4.2 3.5 - 5.2 mmol/L   Chloride 106 96 - 106 mmol/L   CO2 19 (L) 20 - 29 mmol/L   Calcium 9.3 8.7 - 10.2 mg/dL   Total Protein 6.9 6.0 - 8.5 g/dL   Albumin 4.5 3.5 - 5.5 g/dL   Globulin, Total 2.4 1.5 - 4.5 g/dL   Albumin/Globulin Ratio 1.9 1.2 - 2.2   Bilirubin Total 0.7 0.0 - 1.2 mg/dL   Alkaline Phosphatase 103 39 - 117 IU/L   AST 26 0 - 40 IU/L   ALT 14 0 - 44 IU/L  HIV antibody  Result Value Ref Range   HIV Screen 4th Generation wRfx Non Reactive Non Reactive  Lipid Panel w/o Chol/HDL Ratio  Result Value Ref Range   Cholesterol, Total 130 100 - 199 mg/dL   Triglycerides 96 0 - 149 mg/dL   HDL 44 >78 mg/dL   VLDL Cholesterol Cal 19 5  - 40 mg/dL   LDL Calculated 67 0 - 99 mg/dL  TSH  Result Value Ref Range   TSH 1.060 0.450 - 4.500 uIU/mL  UA/M w/rflx Culture, Routine  Result Value Ref Range   Specific Gravity, UA 1.025 1.005 - 1.030  pH, UA 5.5 5.0 - 7.5   Color, UA Yellow Yellow   Appearance Ur Clear Clear   Leukocytes, UA Negative Negative   Protein, UA Negative Negative/Trace   Glucose, UA Negative Negative   Ketones, UA Negative Negative   RBC, UA Negative Negative   Bilirubin, UA Negative Negative   Urobilinogen, Ur 0.2 0.2 - 1.0 mg/dL   Nitrite, UA Negative Negative   Microscopic Examination See below:   Uric acid  Result Value Ref Range   Uric Acid 4.6 3.7 - 8.6 mg/dL  Lyme Ab/Western Blot Reflex  Result Value Ref Range   Lyme IgG/IgM Ab <0.91 0.00 - 0.90 ISR   LYME DISEASE AB, QUANT, IGM <0.80 0.00 - 0.79 index  Hepatitis, Acute  Result Value Ref Range   Hep A IgM Negative Negative   Hepatitis B Surface Ag Negative Negative   Hep B C IgM Negative Negative   Hep C Virus Ab <0.1 0.0 - 0.9 s/co ratio  Babesia microti Antibody Panel  Result Value Ref Range   Babesia microti IgM <1:10 Neg:<1:10   Babesia microti IgG <1:10 Neg:<1:10  Ehrlichia Antibody Panel  Result Value Ref Range   E.Chaffeensis (HME) IgG Negative Neg:<1:64   E. Chaffeensis (HME) IgM Titer Negative Neg:<1:20   HGE IgG Titer Negative Neg:<1:64   HGE IgM Titer Negative Neg:<1:20  Rocky mtn spotted fvr abs pnl(IgG+IgM)  Result Value Ref Range   RMSF IgG Positive (A) Negative   RMSF IgM 0.43 0.00 - 0.89 index  HSV(herpes simplex vrs) 1+2 ab-IgG  Result Value Ref Range   HSV 1 Glycoprotein G Ab, IgG <0.91 0.00 - 0.90 index   HSV 2 IgG, Type Spec <0.91 0.00 - 0.90 index  RPR  Result Value Ref Range   RPR Ser Ql Non Reactive Non Reactive  CBC with Differential/Platelet  Result Value Ref Range   WBC CANCELED x10E3/uL  Comprehensive metabolic panel  Result Value Ref Range   Glucose CANCELED mg/dL  Lipid Panel w/o Chol/HDL  Ratio  Result Value Ref Range   Cholesterol, Total CANCELED mg/dL  Ehrlichia antibody panel  Result Value Ref Range   E.Chaffeensis (HME) IgG CANCELED    E. Chaffeensis (HME) IgM Titer CANCELED    HGE IgG Titer CANCELED    HGE IgM Titer CANCELED   Hepatitis panel, acute  Result Value Ref Range   Hep A IgM CANCELED   Babesia microti Antibody Panel  Result Value Ref Range   Babesia microti IgM CANCELED    Babesia microti IgG CANCELED   HSV(herpes simplex vrs) 1+2 ab-IgG  Result Value Ref Range   HSV 1 Glycoprotein G Ab, IgG CANCELED index   HSV 2 IgG, Type Spec CANCELED index  Rocky mtn spotted fvr abs pnl(IgG+IgM)  Result Value Ref Range   RMSF IgG CANCELED    RMSF IgM CANCELED index  Lyme Ab/Western Blot Reflex  Result Value Ref Range   Lyme IgG/IgM Ab CANCELED ISR   LYME DISEASE AB, QUANT, IGM CANCELED index  TSH  Result Value Ref Range   TSH CANCELED uIU/mL  RPR  Result Value Ref Range   RPR Ser Ql CANCELED   HIV antibody  Result Value Ref Range   HIV Screen 4th Generation wRfx CANCELED   Uric acid  Result Value Ref Range   Uric Acid CANCELED mg/dL  QuantiFERON In Tube  Result Value Ref Range   QUANTIFERON TB GOLD Negative Negative   QUANTIFERON CRITERIA Comment    QUANTIFERON TB AG  VALUE 0.04 IU/mL   Quantiferon Nil Value 0.04 IU/mL   QUANTIFERON MITOGEN VALUE >10.00 IU/mL   QFT TB AG MINUS NIL VALUE 0.00 IU/mL   Interpretation: Comment   RMSF, IgG, IFA  Result Value Ref Range   RMSF, IGG, IFA 1:64 (H) Neg <1:64      Assessment & Plan:   Problem List Items Addressed This Visit      Other   Depression, major, single episode, moderate (HCC) - Primary    Not under good control. Will increase his prozac to  and recheck in 1 month.       Relevant Medications   FLUoxetine (PROZAC) 40 MG capsule    Other Visit Diagnoses    Acute bilateral low back pain without sciatica       Will start stretches and continue naproxen. Call with any concerns. If not  improving, will send to PT.        Follow up plan: Return in about 4 weeks (around 01/17/2017) for follow up mood.

## 2017-01-24 ENCOUNTER — Ambulatory Visit: Payer: BLUE CROSS/BLUE SHIELD | Admitting: Family Medicine

## 2017-01-31 ENCOUNTER — Ambulatory Visit (INDEPENDENT_AMBULATORY_CARE_PROVIDER_SITE_OTHER): Payer: BLUE CROSS/BLUE SHIELD | Admitting: Family Medicine

## 2017-01-31 ENCOUNTER — Encounter: Payer: Self-pay | Admitting: Family Medicine

## 2017-01-31 DIAGNOSIS — M25561 Pain in right knee: Secondary | ICD-10-CM | POA: Diagnosis not present

## 2017-01-31 DIAGNOSIS — M25562 Pain in left knee: Secondary | ICD-10-CM

## 2017-01-31 DIAGNOSIS — F321 Major depressive disorder, single episode, moderate: Secondary | ICD-10-CM

## 2017-01-31 MED ORDER — FLUOXETINE HCL 40 MG PO CAPS: 40.0000 mg | ORAL_CAPSULE | Freq: Every day | ORAL | 1 refills | Status: DC

## 2017-01-31 MED ORDER — BUPROPION HCL ER (SR) 150 MG PO TB12: ORAL_TABLET | ORAL | 3 refills | Status: DC

## 2017-01-31 NOTE — Progress Notes (Signed)
BP 124/77 (BP Location: Left Arm, Patient Position: Sitting, Cuff Size: Normal)   Pulse 60   Temp 98.2 F (36.8 C)   Wt 136 lb 4 oz (61.8 kg)   SpO2 99%   BMI 23.76 kg/m    Subjective:    Patient ID: Sean Vega, male    DOB: March 03, 1991, 26 y.o.   MRN: [id number]  HPI: Sean Vega is a 26 y.o. male  Chief Complaint  Patient presents with  . Depression   DEPRESSION Mood status: uncontrolled Satisfied with current treatment?: no Symptom severity: moderate  Duration of current treatment : months Side effects: no Medication compliance: excellent compliance Psychotherapy/counseling: no  Previous psychiatric medications: prozac Depressed mood: yes Anxious mood: yes Anhedonia: no Significant weight loss or gain: no Insomnia: no  Fatigue: yes Feelings of worthlessness or guilt: yes Impaired concentration/indecisiveness: yes Suicidal ideations: no Hopelessness: no Crying spells: no Depression screen [facility] 2/9 01/31/2017 12/20/2016 12/06/2016 11/22/2016  Decreased Interest 3 3 3  0  Down, Depressed, Hopeless 3 3 2 3   PHQ - 2 Score 6 6 5 3   Altered sleeping 3 2 3  0  Tired, decreased energy 2 3 2 3   Change in appetite 3 3 3 3   Feeling bad or failure about yourself  2 2 1 2   Trouble concentrating 3 3 3 3   Moving slowly or fidgety/restless 2 1 1 3   Suicidal thoughts 0 0 0 0  PHQ-9 Score 21 20 18 17   Difficult doing work/chores Somewhat difficult Very difficult Very difficult Very difficult   Knee is still hurting. Naproxen doesn't seem to be helping. He hasn't had his x-ray yet. Seems to come and go. Sometimes worse with exercise, sometimes better. He notes that his Mom told him something was wrong with his knees when he was little. No other concerns.   Relevant past medical, surgical, family and social history reviewed and updated as indicated. Interim medical history since our last visit reviewed. Allergies and medications reviewed and updated.  Review of  Systems  Constitutional: Negative.   Respiratory: Negative.   Cardiovascular: Negative.   Musculoskeletal: Positive for arthralgias. Negative for back pain, gait problem, joint swelling, myalgias, neck pain and neck stiffness.  Psychiatric/Behavioral: Negative.     Per HPI unless specifically indicated above     Objective:    BP 124/77 (BP Location: Left Arm, Patient Position: Sitting, Cuff Size: Normal)   Pulse 60   Temp 98.2 F (36.8 C)   Wt 136 lb 4 oz (61.8 kg)   SpO2 99%   BMI 23.76 kg/m   Wt Readings from Last 3 Encounters:  01/31/17 136 lb 4 oz (61.8 kg)  12/20/16 135 lb 9.6 oz (61.5 kg)  12/06/16 132 lb 8 oz (60.1 kg)    Physical Exam  Constitutional: He is oriented to person, place, and time. He appears well-developed and well-nourished. No distress.  HENT:  Head: Normocephalic and atraumatic.  Right Ear: Hearing normal.  Left Ear: Hearing normal.  Nose: Nose normal.  Mouth/Throat: No oropharyngeal exudate.  Eyes: Conjunctivae and lids are normal. Right eye exhibits no discharge. Left eye exhibits no discharge. No scleral icterus.  Cardiovascular: Normal rate, regular rhythm, normal heart sounds and intact distal pulses.  Exam reveals no gallop and no friction rub.   No murmur heard. Pulmonary/Chest: Effort normal and breath sounds normal. No respiratory distress. He has no wheezes. He has no rales. He exhibits no tenderness.  Musculoskeletal: Normal range of motion.  Neurological: He is alert and oriented to person, place, and time.  Skin: Skin is warm, dry and intact. No rash noted. He is not diaphoretic. No erythema. No pallor.  Psychiatric: He has a normal mood and affect. His speech is normal and behavior is normal. Judgment and thought content normal. Cognition and memory are normal.  Nursing note and vitals reviewed.   Results for orders placed or performed in visit on 12/06/16  GC/Chlamydia Probe Amp  Result Value Ref Range   Chlamydia trachomatis, NAA  Negative Negative   Neisseria gonorrhoeae by PCR Negative Negative  Microscopic Examination  Result Value Ref Range   WBC, UA 0-5 0 - 5 /hpf   RBC, UA None seen 0 - 2 /hpf   Epithelial Cells (non renal) 0-10 0 - 10 /hpf   Bacteria, UA None seen None seen/Few  Quantiferon tb gold assay (blood)  Result Value Ref Range   QUANTIFERON INCUBATION Comment   CBC with Differential/Platelet  Result Value Ref Range   WBC 6.3 3.4 - 10.8 x10E3/uL   RBC 5.30 4.14 - 5.80 x10E6/uL   Hemoglobin 14.8 13.0 - 17.7 g/dL   Hematocrit 16.1 09.6 - 51.0 %   MCV 89 79 - 97 fL   MCH 27.9 26.6 - 33.0 pg   MCHC 31.5 31.5 - 35.7 g/dL   RDW 04.5 40.9 - 81.1 %   Platelets 180 150 - 379 x10E3/uL   Neutrophils 49 Not Estab. %   Lymphs 35 Not Estab. %   Monocytes 7 Not Estab. %   Eos 8 Not Estab. %   Basos 1 Not Estab. %   Neutrophils Absolute 3.1 1.4 - 7.0 x10E3/uL   Lymphocytes Absolute 2.2 0.7 - 3.1 x10E3/uL   Monocytes Absolute 0.4 0.1 - 0.9 x10E3/uL   EOS (ABSOLUTE) 0.5 (H) 0.0 - 0.4 x10E3/uL   Basophils Absolute 0.0 0.0 - 0.2 x10E3/uL   Immature Granulocytes 0 Not Estab. %   Immature Grans (Abs) 0.0 0.0 - 0.1 x10E3/uL  Comprehensive metabolic panel  Result Value Ref Range   Glucose 83 65 - 99 mg/dL   BUN 17 6 - 20 mg/dL   Creatinine, Ser 9.14 0.76 - 1.27 mg/dL   GFR calc non Af Amer 125 >59 mL/min/1.73   GFR calc Af Amer 145 >59 mL/min/1.73   BUN/Creatinine Ratio 21 (H) 9 - 20   Sodium 141 134 - 144 mmol/L   Potassium 4.2 3.5 - 5.2 mmol/L   Chloride 106 96 - 106 mmol/L   CO2 19 (L) 20 - 29 mmol/L   Calcium 9.3 8.7 - 10.2 mg/dL   Total Protein 6.9 6.0 - 8.5 g/dL   Albumin 4.5 3.5 - 5.5 g/dL   Globulin, Total 2.4 1.5 - 4.5 g/dL   Albumin/Globulin Ratio 1.9 1.2 - 2.2   Bilirubin Total 0.7 0.0 - 1.2 mg/dL   Alkaline Phosphatase 103 39 - 117 IU/L   AST 26 0 - 40 IU/L   ALT 14 0 - 44 IU/L  HIV antibody  Result Value Ref Range   HIV Screen 4th Generation wRfx Non Reactive Non Reactive  Lipid  Panel w/o Chol/HDL Ratio  Result Value Ref Range   Cholesterol, Total 130 100 - 199 mg/dL   Triglycerides 96 0 - 149 mg/dL   HDL 44 >78 mg/dL   VLDL Cholesterol Cal 19 5 - 40 mg/dL   LDL Calculated 67 0 - 99 mg/dL  TSH  Result Value Ref Range   TSH 1.060 0.450 -  4.500 uIU/mL  UA/M w/rflx Culture, Routine  Result Value Ref Range   Specific Gravity, UA 1.025 1.005 - 1.030   pH, UA 5.5 5.0 - 7.5   Color, UA Yellow Yellow   Appearance Ur Clear Clear   Leukocytes, UA Negative Negative   Protein, UA Negative Negative/Trace   Glucose, UA Negative Negative   Ketones, UA Negative Negative   RBC, UA Negative Negative   Bilirubin, UA Negative Negative   Urobilinogen, Ur 0.2 0.2 - 1.0 mg/dL   Nitrite, UA Negative Negative   Microscopic Examination See below:   Uric acid  Result Value Ref Range   Uric Acid 4.6 3.7 - 8.6 mg/dL  Lyme Ab/Western Blot Reflex  Result Value Ref Range   Lyme IgG/IgM Ab <0.91 0.00 - 0.90 ISR   LYME DISEASE AB, QUANT, IGM <0.80 0.00 - 0.79 index  Hepatitis, Acute  Result Value Ref Range   Hep A IgM Negative Negative   Hepatitis B Surface Ag Negative Negative   Hep B C IgM Negative Negative   Hep C Virus Ab <0.1 0.0 - 0.9 s/co ratio  Babesia microti Antibody Panel  Result Value Ref Range   Babesia microti IgM <1:10 Neg:<1:10   Babesia microti IgG <1:10 Neg:<1:10  Ehrlichia Antibody Panel  Result Value Ref Range   E.Chaffeensis (HME) IgG Negative Neg:<1:64   E. Chaffeensis (HME) IgM Titer Negative Neg:<1:20   HGE IgG Titer Negative Neg:<1:64   HGE IgM Titer Negative Neg:<1:20  Rocky mtn spotted fvr abs pnl(IgG+IgM)  Result Value Ref Range   RMSF IgG Positive (A) Negative   RMSF IgM 0.43 0.00 - 0.89 index  HSV(herpes simplex vrs) 1+2 ab-IgG  Result Value Ref Range   HSV 1 Glycoprotein G Ab, IgG <0.91 0.00 - 0.90 index   HSV 2 IgG, Type Spec <0.91 0.00 - 0.90 index  RPR  Result Value Ref Range   RPR Ser Ql Non Reactive Non Reactive  CBC with  Differential/Platelet  Result Value Ref Range   WBC CANCELED x10E3/uL  Comprehensive metabolic panel  Result Value Ref Range   Glucose CANCELED mg/dL  Lipid Panel w/o Chol/HDL Ratio  Result Value Ref Range   Cholesterol, Total CANCELED mg/dL  Ehrlichia antibody panel  Result Value Ref Range   E.Chaffeensis (HME) IgG CANCELED    E. Chaffeensis (HME) IgM Titer CANCELED    HGE IgG Titer CANCELED    HGE IgM Titer CANCELED   Hepatitis panel, acute  Result Value Ref Range   Hep A IgM CANCELED   Babesia microti Antibody Panel  Result Value Ref Range   Babesia microti IgM CANCELED    Babesia microti IgG CANCELED   HSV(herpes simplex vrs) 1+2 ab-IgG  Result Value Ref Range   HSV 1 Glycoprotein G Ab, IgG CANCELED index   HSV 2 IgG, Type Spec CANCELED index  Rocky mtn spotted fvr abs pnl(IgG+IgM)  Result Value Ref Range   RMSF IgG CANCELED    RMSF IgM CANCELED index  Lyme Ab/Western Blot Reflex  Result Value Ref Range   Lyme IgG/IgM Ab CANCELED ISR   LYME DISEASE AB, QUANT, IGM CANCELED index  TSH  Result Value Ref Range   TSH CANCELED uIU/mL  RPR  Result Value Ref Range   RPR Ser Ql CANCELED   HIV antibody  Result Value Ref Range   HIV Screen 4th Generation wRfx CANCELED   Uric acid  Result Value Ref Range   Uric Acid CANCELED mg/dL  QuantiFERON In Tube  Result Value Ref Range   QUANTIFERON TB GOLD Negative Negative   QUANTIFERON CRITERIA Comment    QUANTIFERON TB AG VALUE 0.04 IU/mL   Quantiferon Nil Value 0.04 IU/mL   QUANTIFERON MITOGEN VALUE >10.00 IU/mL   QFT TB AG MINUS NIL VALUE 0.00 IU/mL   Interpretation: Comment   RMSF, IgG, IFA  Result Value Ref Range   RMSF, IGG, IFA 1:64 (H) Neg <1:64      Assessment & Plan:   Problem List Items Addressed This Visit      Other   Depression, major, single episode, moderate (HCC)    No better. Will add wellbutrin. Recheck 1 month. Call with any concerns.       Relevant Medications   buPROPion (WELLBUTRIN SR) 150  MG 12 hr tablet   FLUoxetine (PROZAC) 40 MG capsule   Arthralgia of both knees    Not doing better. Will get x-ray if normal PT. If abnormal referral to ortho. Call with any concerns.           Follow up plan: Return in about 4 weeks (around 02/28/2017) for Follow up mood and knees.

## 2017-01-31 NOTE — Assessment & Plan Note (Signed)
Not doing better. Will get x-ray if normal PT. If abnormal referral to ortho. Call with any concerns.

## 2017-01-31 NOTE — Assessment & Plan Note (Signed)
No better. Will add wellbutrin. Recheck 1 month. Call with any concerns.

## 2017-03-07 ENCOUNTER — Ambulatory Visit: Payer: BLUE CROSS/BLUE SHIELD | Admitting: Family Medicine

## 2017-03-19 ENCOUNTER — Ambulatory Visit: Payer: BLUE CROSS/BLUE SHIELD | Admitting: Family Medicine

## 2017-03-21 ENCOUNTER — Ambulatory Visit (INDEPENDENT_AMBULATORY_CARE_PROVIDER_SITE_OTHER): Payer: BLUE CROSS/BLUE SHIELD | Admitting: Family Medicine

## 2017-03-21 ENCOUNTER — Encounter: Payer: Self-pay | Admitting: Family Medicine

## 2017-03-21 VITALS — BP 138/86 | HR 70 | Wt 134.0 lb

## 2017-03-21 DIAGNOSIS — R361 Hematospermia: Secondary | ICD-10-CM

## 2017-03-21 LAB — UA/M W/RFLX CULTURE, ROUTINE
BILIRUBIN UA: NEGATIVE
GLUCOSE, UA: NEGATIVE
KETONES UA: NEGATIVE
Leukocytes, UA: NEGATIVE
NITRITE UA: NEGATIVE
Protein, UA: NEGATIVE
RBC, UA: NEGATIVE
Specific Gravity, UA: 1.015 (ref 1.005–1.030)
UUROB: 1 mg/dL (ref 0.2–1.0)
pH, UA: 5.5 (ref 5.0–7.5)

## 2017-03-21 NOTE — Progress Notes (Signed)
BP 138/86   Pulse 70   Wt 134 lb (60.8 kg)   SpO2 100%   BMI 23.36 kg/m    Subjective:    Patient ID: Sean Vega, male    DOB: 1990-11-03, 26 y.o.   MRN: [id number]  HPI: Sean Vega is a 26 y.o. male  Chief Complaint  Patient presents with  . Hematuria   Patient here with two episodes this week of bloody ejaculate. This has never happened before for him. Only changes recently have been he fell from a small ladder onto his buttocks at work a few days ago and was hit in the groin with a softball at his game the other day. Denies dysuria, penile discharge, lesions, recent exposures to STIs since negative testing in September, fevers, chills, prostate tenderness.   Relevant past medical, surgical, family and social history reviewed and updated as indicated. Interim medical history since our last visit reviewed. Allergies and medications reviewed and updated.  Review of Systems  Constitutional: Negative.   Respiratory: Negative.   Cardiovascular: Negative.   Gastrointestinal: Negative.   Genitourinary:       Bloody ejaculate  Musculoskeletal: Negative.   Neurological: Negative.   Psychiatric/Behavioral: Negative.     Per HPI unless specifically indicated above     Objective:    BP 138/86   Pulse 70   Wt 134 lb (60.8 kg)   SpO2 100%   BMI 23.36 kg/m   Wt Readings from Last 3 Encounters:  03/21/17 134 lb (60.8 kg)  01/31/17 136 lb 4 oz (61.8 kg)  12/20/16 135 lb 9.6 oz (61.5 kg)    Physical Exam  Constitutional: He is oriented to person, place, and time. He appears well-developed and well-nourished. No distress.  HENT:  Head: Atraumatic.  Eyes: Conjunctivae are normal. Pupils are equal, round, and reactive to light. No scleral icterus.  Neck: Normal range of motion. Neck supple.  Cardiovascular: Normal rate and normal heart sounds.  Pulmonary/Chest: Effort normal and breath sounds normal. No respiratory distress.  Genitourinary:    Genitourinary Comments: Exam declined  Musculoskeletal: Normal range of motion.  Lymphadenopathy:    He has no cervical adenopathy.  Neurological: He is alert and oriented to person, place, and time.  Skin: Skin is warm and dry.  Psychiatric: He has a normal mood and affect. His behavior is normal.  Nursing note and vitals reviewed.   Results for orders placed or performed in visit on 03/21/17  UA/M w/rflx Culture, Routine  Result Value Ref Range   Specific Gravity, UA 1.015 1.005 - 1.030   pH, UA 5.5 5.0 - 7.5   Color, UA Yellow Yellow   Appearance Ur Clear Clear   Leukocytes, UA Negative Negative   Protein, UA Negative Negative/Trace   Glucose, UA Negative Negative   Ketones, UA Negative Negative   RBC, UA Negative Negative   Bilirubin, UA Negative Negative   Urobilinogen, Ur 1.0 0.2 - 1.0 mg/dL   Nitrite, UA Negative Negative      Assessment & Plan:   Problem List Items Addressed This Visit    None    Visit Diagnoses    Bloody ejaculation    -  Primary   Relevant Orders   UA/M w/rflx Culture, Routine (Completed)    Pt afebrile, U/A WNL. Declined STI testing as he has not had any exposures since testing back in September. Suspect issue from irritation from multiple blunt traumas to pelvic area the past week. Recommended  rest from all sexual activity for at least a week, if issue persists after will refer to urology for further workup.    Follow up plan: Return if symptoms worsen or fail to improve.

## 2017-03-24 NOTE — Patient Instructions (Signed)
Follow up as needed

## 2017-04-11 ENCOUNTER — Ambulatory Visit: Payer: BLUE CROSS/BLUE SHIELD | Admitting: Family Medicine

## 2017-04-25 ENCOUNTER — Encounter: Payer: Self-pay | Admitting: Family Medicine

## 2017-04-25 ENCOUNTER — Ambulatory Visit (INDEPENDENT_AMBULATORY_CARE_PROVIDER_SITE_OTHER): Payer: BLUE CROSS/BLUE SHIELD | Admitting: Family Medicine

## 2017-04-25 ENCOUNTER — Ambulatory Visit
Admission: RE | Admit: 2017-04-25 | Discharge: 2017-04-25 | Disposition: A | Discharge status: Home / Self Care | Payer: BLUE CROSS/BLUE SHIELD | Source: Ambulatory Visit | Attending: Family Medicine | Admitting: Family Medicine

## 2017-04-25 VITALS — BP 127/89 | HR 80 | Temp 98.0°F | Wt 136.1 lb

## 2017-04-25 DIAGNOSIS — G8929 Other chronic pain: Secondary | ICD-10-CM

## 2017-04-25 DIAGNOSIS — M25561 Pain in right knee: Secondary | ICD-10-CM | POA: Diagnosis not present

## 2017-04-25 DIAGNOSIS — M25562 Pain in left knee: Secondary | ICD-10-CM

## 2017-04-25 DIAGNOSIS — J209 Acute bronchitis, unspecified: Secondary | ICD-10-CM

## 2017-04-25 MED ORDER — DOXYCYCLINE HYCLATE 100 MG PO TABS: 100.0000 mg | ORAL_TABLET | Freq: Two times a day (BID) | ORAL | 0 refills | Status: DC

## 2017-04-25 MED ORDER — PREDNISONE 10 MG PO TABS: ORAL_TABLET | ORAL | 0 refills | Status: DC

## 2017-04-25 MED ORDER — ALBUTEROL SULFATE (2.5 MG/3ML) 0.083% IN NEBU
2.5000 mg | INHALATION_SOLUTION | Freq: Once | RESPIRATORY_TRACT | Status: AC
  Administered 2017-04-25: 2.5 mg via RESPIRATORY_TRACT

## 2017-04-25 MED ORDER — IBUPROFEN 800 MG PO TABS: 800.0000 mg | ORAL_TABLET | Freq: Three times a day (TID) | ORAL | 3 refills | Status: AC | PRN

## 2017-04-25 NOTE — Assessment & Plan Note (Signed)
No better. Will change to ibuprofen from naproxen after the prednisone. Will obtain x-rays. Will treat with doxy for +RMSF- call with any concerns.

## 2017-04-25 NOTE — Progress Notes (Signed)
BP 127/89 (BP Location: Left Arm, Patient Position: Sitting, Cuff Size: Normal)   Pulse 80   Temp 98 F (36.7 C)   Wt 136 lb 1 oz (61.7 kg)   SpO2 98%   BMI 23.72 kg/m    Subjective:    Patient ID: Sean Vega, male    DOB: 08-29-1990, 27 y.o.   MRN: [id number]  HPI: Sean Vega is a 27 y.o. male  Chief Complaint  Patient presents with  . Knee Pain    Bilateral   ARTHRALGIAS / JOINT ACHES Duration: months Pain: yes Symmetric: no  moderate Quality: dull and aching Frequency: constant Context:  worse Decreased function/range of motion: no  Erythema: no Swelling: no Heat or warmth: no Morning stiffness: no Aggravating factors: work and basketball Alleviating factors: nothing Relief with NSAIDs?: mild Treatments attempted: naproxen, stretches   Involved Joints:     Hands: no     Wrists: no      Elbows: yes bilateral    Shoulders: no     Back: yes     Hips: no     Knees: yes bilateral    Ankles: yes right    Feet: no   UPPER RESPIRATORY TRACT INFECTION Duration: About a week Worst symptom: cough, SOB Fever: no Cough: yes Shortness of breath: yes Wheezing: yes Chest pain: yes, with cough Chest tightness: yes Chest congestion: yes Nasal congestion: no Runny nose: no Post nasal drip: no Sneezing: no Sore throat: no Swollen glands: no Sinus pressure: no Headache: no Face pain: no Toothache: no Ear pain: no  Ear pressure: no  Eyes red/itching:no Eye drainage/crusting: no  Vomiting: no Rash: no Fatigue: no Sick contacts: yes Strep contacts: no  Context: worse Recurrent sinusitis: no Relief with OTC cold/cough medications: no  Treatments attempted: none   Relevant past medical, surgical, family and social history reviewed and updated as indicated. Interim medical history since our last visit reviewed. Allergies and medications reviewed and updated.  Review of Systems  Constitutional: Negative.   HENT: Positive for  congestion. Negative for dental problem, drooling, ear discharge, ear pain, facial swelling, hearing loss, mouth sores, nosebleeds, postnasal drip, rhinorrhea, sinus pressure, sinus pain, sneezing, sore throat, tinnitus, trouble swallowing and voice change.   Respiratory: Positive for cough, shortness of breath and wheezing. Negative for apnea, choking, chest tightness and stridor.   Cardiovascular: Negative.   Psychiatric/Behavioral: Negative.     Per HPI unless specifically indicated above     Objective:    BP 127/89 (BP Location: Left Arm, Patient Position: Sitting, Cuff Size: Normal)   Pulse 80   Temp 98 F (36.7 C)   Wt 136 lb 1 oz (61.7 kg)   SpO2 98%   BMI 23.72 kg/m   Wt Readings from Last 3 Encounters:  04/25/17 136 lb 1 oz (61.7 kg)  03/21/17 134 lb (60.8 kg)  01/31/17 136 lb 4 oz (61.8 kg)    Physical Exam  Constitutional: He is oriented to person, place, and time. He appears well-developed and well-nourished. No distress.  HENT:  Head: Normocephalic and atraumatic.  Right Ear: Hearing and external ear normal.  Left Ear: Hearing and external ear normal.  Nose: Nose normal.  Mouth/Throat: Oropharynx is clear and moist. No oropharyngeal exudate.  Eyes: Conjunctivae, EOM and lids are normal. Pupils are equal, round, and reactive to light. Right eye exhibits no discharge. Left eye exhibits no discharge. No scleral icterus.  Neck: Normal range of motion. Neck supple.  No JVD present. No tracheal deviation present. No thyromegaly present.  Cardiovascular: Normal rate, regular rhythm, normal heart sounds and intact distal pulses. Exam reveals no gallop and no friction rub.  No murmur heard. Pulmonary/Chest: Effort normal. No stridor. No respiratory distress. He has wheezes. He has no rales. He exhibits no tenderness.  Musculoskeletal: Normal range of motion. He exhibits tenderness. He exhibits no edema or deformity.  Lymphadenopathy:    He has no cervical adenopathy.    Neurological: He is alert and oriented to person, place, and time.  Skin: Skin is warm, dry and intact. No rash noted. He is not diaphoretic. No erythema. No pallor.  Psychiatric: He has a normal mood and affect. His speech is normal and behavior is normal. Judgment and thought content normal. Cognition and memory are normal.    Results for orders placed or performed in visit on 03/21/17  UA/M w/rflx Culture, Routine  Result Value Ref Range   Specific Gravity, UA 1.015 1.005 - 1.030   pH, UA 5.5 5.0 - 7.5   Color, UA Yellow Yellow   Appearance Ur Clear Clear   Leukocytes, UA Negative Negative   Protein, UA Negative Negative/Trace   Glucose, UA Negative Negative   Ketones, UA Negative Negative   RBC, UA Negative Negative   Bilirubin, UA Negative Negative   Urobilinogen, Ur 1.0 0.2 - 1.0 mg/dL   Nitrite, UA Negative Negative      Assessment & Plan:   Problem List Items Addressed This Visit      Other   Arthralgia of both knees - Primary    No better. Will change to ibuprofen from naproxen after the prednisone. Will obtain x-rays. Will treat with doxy for +RMSF- call with any concerns.        Other Visit Diagnoses    Chronic pain of both knees       Relevant Medications   ibuprofen (ADVIL,MOTRIN) 800 MG tablet   predniSONE (DELTASONE) 10 MG tablet   Other Relevant Orders   DG Knee Complete 4 Views Left   Acute bronchitis, unspecified organism       Will treat with prednisone and doxycycline. Call with any concerns. Recheck 2 weeks with spiro   Relevant Medications   albuterol (PROVENTIL) (2.5 MG/3ML) 0.083% nebulizer solution 2.5 mg (Completed)       Follow up plan: Return in about 2 weeks (around 05/09/2017) for Follow up breathing.

## 2017-04-26 ENCOUNTER — Telehealth: Payer: Self-pay | Admitting: Family Medicine

## 2017-04-26 NOTE — Telephone Encounter (Signed)
[facility] to call me  Back regarding his x-ray results. X-rays were normal. Will refer to PT for evaluation and to help with pain from work. Call with any concerns. OK to give him this message if he calls. CRM generated.

## 2017-04-26 NOTE — Telephone Encounter (Signed)
Pt called     Results   Of    X  Rays    Given to  Pt .  Advised    That  He   Will  Be  Referred  To  PT  For evaul   and  To  Help  Him  With  Pain   From  Work . Call  Back  With  Any  Concerns

## 2017-05-09 ENCOUNTER — Ambulatory Visit (INDEPENDENT_AMBULATORY_CARE_PROVIDER_SITE_OTHER): Payer: BLUE CROSS/BLUE SHIELD | Admitting: Family Medicine

## 2017-05-09 ENCOUNTER — Encounter: Payer: Self-pay | Admitting: Family Medicine

## 2017-05-09 VITALS — BP 113/64 | HR 68 | Temp 97.9°F

## 2017-05-09 DIAGNOSIS — M25561 Pain in right knee: Secondary | ICD-10-CM | POA: Diagnosis not present

## 2017-05-09 DIAGNOSIS — J449 Chronic obstructive pulmonary disease, unspecified: Secondary | ICD-10-CM | POA: Insufficient documentation

## 2017-05-09 DIAGNOSIS — M25562 Pain in left knee: Secondary | ICD-10-CM | POA: Diagnosis not present

## 2017-05-09 DIAGNOSIS — J40 Bronchitis, not specified as acute or chronic: Secondary | ICD-10-CM

## 2017-05-09 MED ORDER — ALBUTEROL SULFATE HFA 108 (90 BASE) MCG/ACT IN AERS: 2.0000 | INHALATION_SPRAY | Freq: Four times a day (QID) | RESPIRATORY_TRACT | 3 refills | Status: AC | PRN

## 2017-05-09 NOTE — Progress Notes (Signed)
BP 113/64 (BP Location: Left Arm, Patient Position: Sitting, Cuff Size: Normal)   Pulse 68   Temp 97.9 F (36.6 C)   SpO2 100%    Subjective:    Patient ID: Sean MunroeMarcus Antonio D Buchbinder, male    DOB: 02/28/1991, 27 y.o.   MRN: [id number]  HPI: Sean Vega is a 27 y.o. male  Chief Complaint  Patient presents with  . lung recheck   Lungs are doing better. Still having a bit of tightness in his chest, but otherwise doing OK. No fevers. No chills. Knees still hurting. Has not done PT yet. Has not finished his antibiotics. No other concerns or complaints at this time.   Relevant past medical, surgical, family and social history reviewed and updated as indicated. Interim medical history since our last visit reviewed. Allergies and medications reviewed and updated.  Review of Systems  Constitutional: Negative.   Respiratory: Positive for chest tightness and shortness of breath. Negative for apnea, cough, choking, wheezing and stridor.   Cardiovascular: Negative.   Musculoskeletal: Positive for arthralgias. Negative for back pain, gait problem, joint swelling, myalgias, neck pain and neck stiffness.  Neurological: Negative.   Psychiatric/Behavioral: Negative.     Per HPI unless specifically indicated above     Objective:    BP 113/64 (BP Location: Left Arm, Patient Position: Sitting, Cuff Size: Normal)   Pulse 68   Temp 97.9 F (36.6 C)   SpO2 100%   Wt Readings from Last 3 Encounters:  04/25/17 136 lb 1 oz (61.7 kg)  03/21/17 134 lb (60.8 kg)  01/31/17 136 lb 4 oz (61.8 kg)    Physical Exam  Constitutional: He is oriented to person, place, and time. He appears well-developed and well-nourished. No distress.  HENT:  Head: Normocephalic and atraumatic.  Right Ear: Hearing normal.  Left Ear: Hearing normal.  Nose: Nose normal.  Eyes: Conjunctivae and lids are normal. Right eye exhibits no discharge. Left eye exhibits no discharge. No scleral icterus.  Cardiovascular:  Normal rate, regular rhythm, normal heart sounds and intact distal pulses. Exam reveals no gallop and no friction rub.  No murmur heard. Pulmonary/Chest: Effort normal. No respiratory distress. He has decreased breath sounds in the right upper field, the right middle field, the right lower field, the left upper field, the left middle field and the left lower field. He has no wheezes. He has no rales. He exhibits no tenderness.  Musculoskeletal: Normal range of motion.  Neurological: He is alert and oriented to person, place, and time.  Skin: Skin is warm, dry and intact. No rash noted. He is not diaphoretic. No erythema. No pallor.  Psychiatric: He has a normal mood and affect. His speech is normal and behavior is normal. Judgment and thought content normal. Cognition and memory are normal.  Nursing note and vitals reviewed.   Results for orders placed or performed in visit on 03/21/17  UA/M w/rflx Culture, Routine  Result Value Ref Range   Specific Gravity, UA 1.015 1.005 - 1.030   pH, UA 5.5 5.0 - 7.5   Color, UA Yellow Yellow   Appearance Ur Clear Clear   Leukocytes, UA Negative Negative   Protein, UA Negative Negative/Trace   Glucose, UA Negative Negative   Ketones, UA Negative Negative   RBC, UA Negative Negative   Bilirubin, UA Negative Negative   Urobilinogen, Ur 1.0 0.2 - 1.0 mg/dL   Nitrite, UA Negative Negative      Assessment & Plan:  Problem List Items Addressed This Visit      Respiratory   COPD (chronic obstructive pulmonary disease) (HCC) - Primary    Mild obstruction on sipro. Will start him on albuterol and he will call with any concerns.       Relevant Medications   albuterol (PROVENTIL HFA;VENTOLIN HFA) 108 (90 Base) MCG/ACT inhaler     Other   Arthralgia of both knees    No better. Will do PT. Order in. Call with any concerns.        Other Visit Diagnoses    Bronchitis       Improved. Call with any concerns.    Relevant Orders   Spirometry with  Graph (Completed)       Follow up plan: Return in about 3 months (around 08/06/2017) for Physical.

## 2017-05-09 NOTE — Assessment & Plan Note (Signed)
Mild obstruction on sipro. Will start him on albuterol and he will call with any concerns.

## 2017-05-09 NOTE — Assessment & Plan Note (Signed)
No better. Will do PT. Order in. Call with any concerns.

## 2017-08-15 ENCOUNTER — Encounter: Payer: BLUE CROSS/BLUE SHIELD | Admitting: Family Medicine

## 2018-10-16 IMAGING — CT CT ABD-PELV W/ CM
2 of 4 series · 16 of 46 positions shown, 18 images · IV contrast (APPLIED)
Comparison: None.

CLINICAL DATA: Mid abdominal pain since this morning.

EXAM:
CT ABDOMEN AND PELVIS WITH CONTRAST
TECHNIQUE: Multidetector CT imaging of the abdomen and pelvis was performed
using the standard protocol following bolus administration of
intravenous contrast.
CONTRAST:  100mL DXEZ26-EPP IOPAMIDOL (DXEZ26-EPP) INJECTION 61%

[Series 2: axial st · axial · 0.64mm/px · z∈[-485,-80]mm · 13 of 89 slices shown, 15 images]
[im 4/89  soft-tissue]
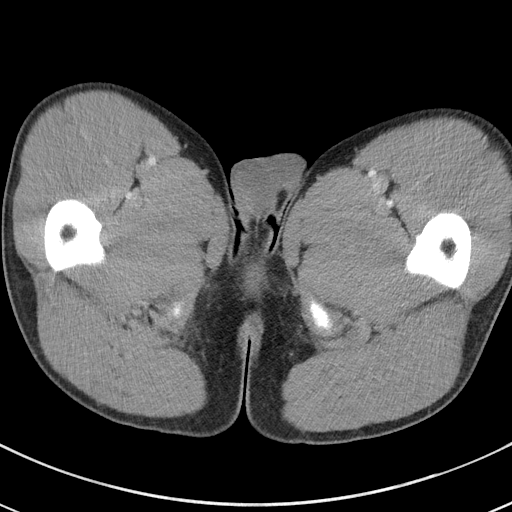
[im 4/89  bone]
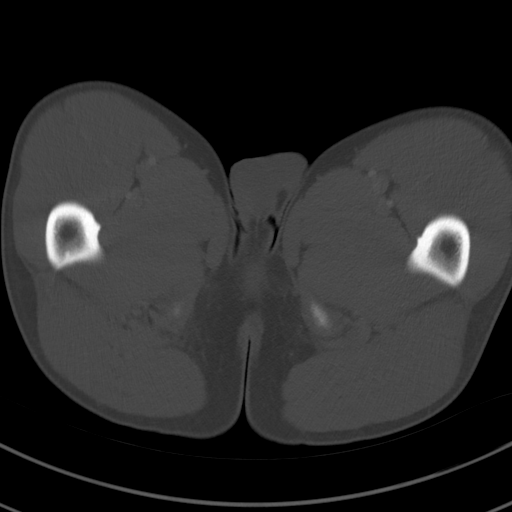
[im 11/89  soft-tissue]
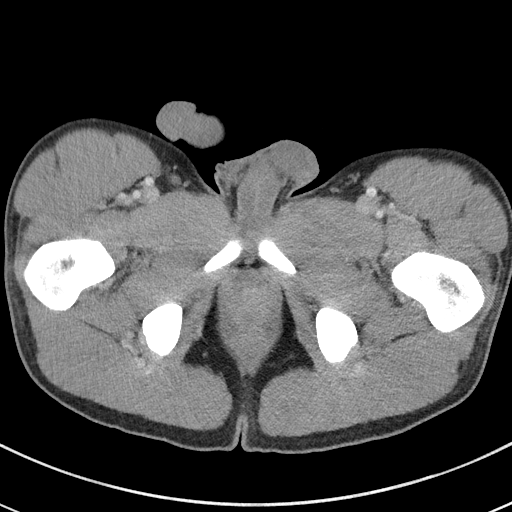
[im 18/89  soft-tissue]
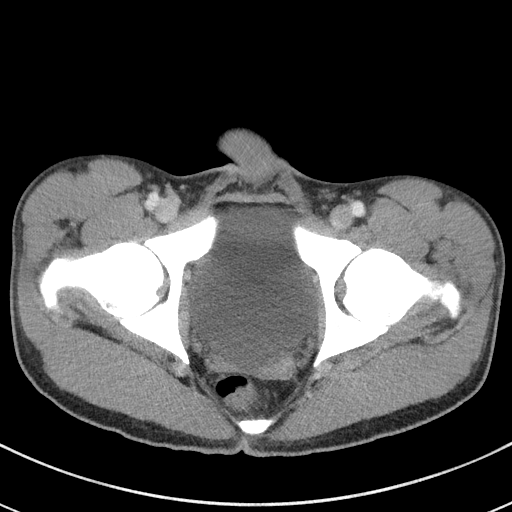
[im 25/89  soft-tissue]
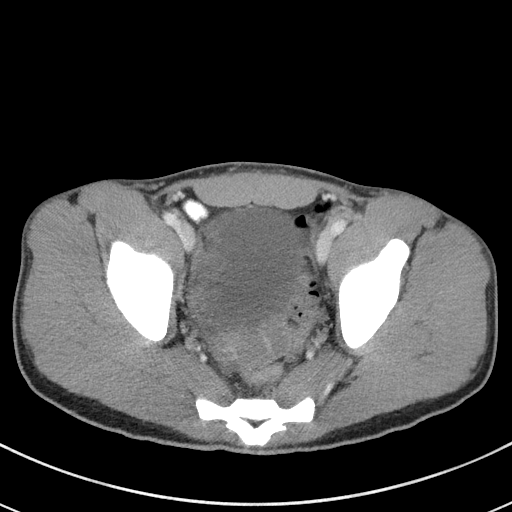
[im 32/89  soft-tissue]
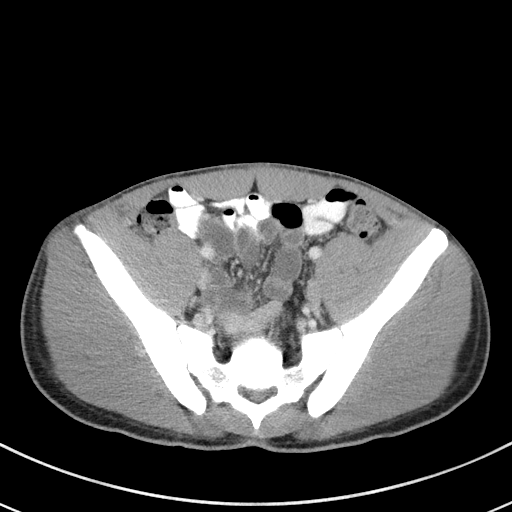
[im 39/89  soft-tissue]
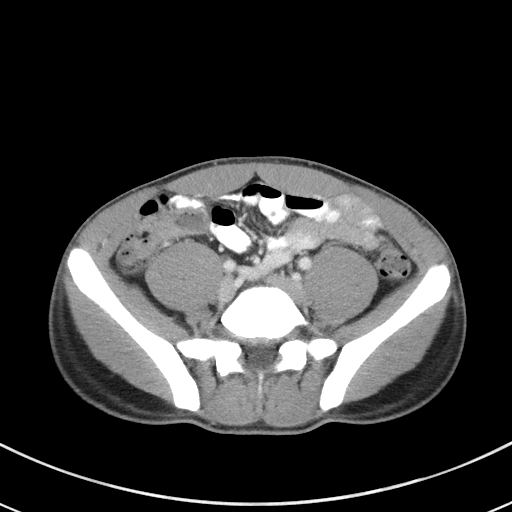
[im 46/89  soft-tissue]
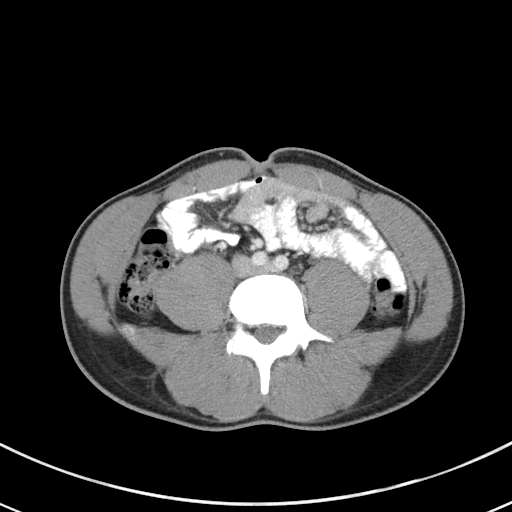
[im 50/89  soft-tissue]
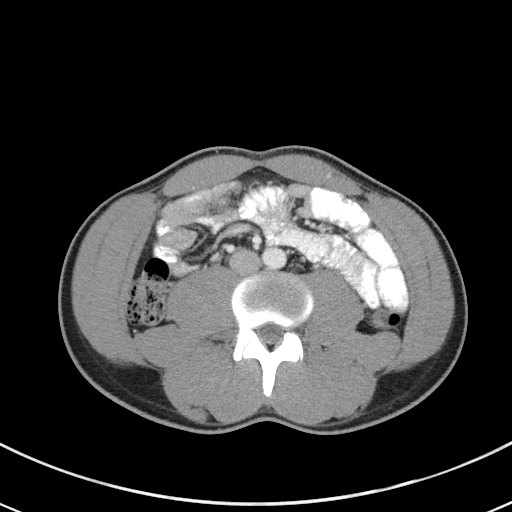
[im 57/89  soft-tissue]
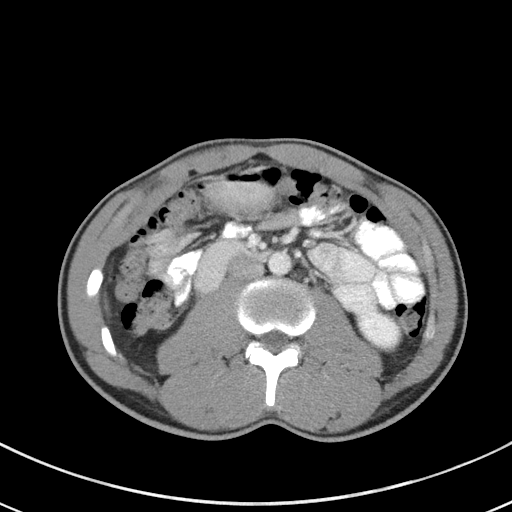
[im 57/89  bone]
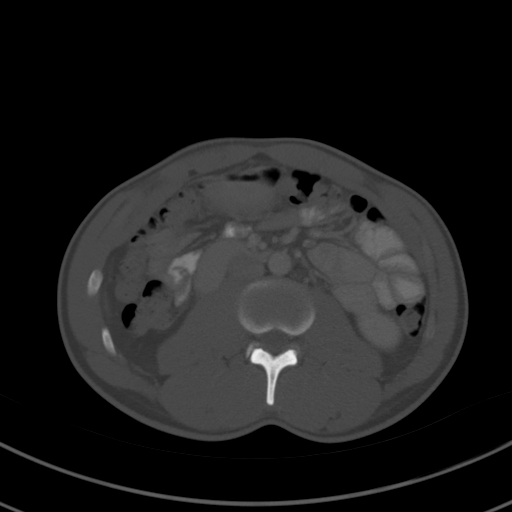
[im 64/89  soft-tissue]
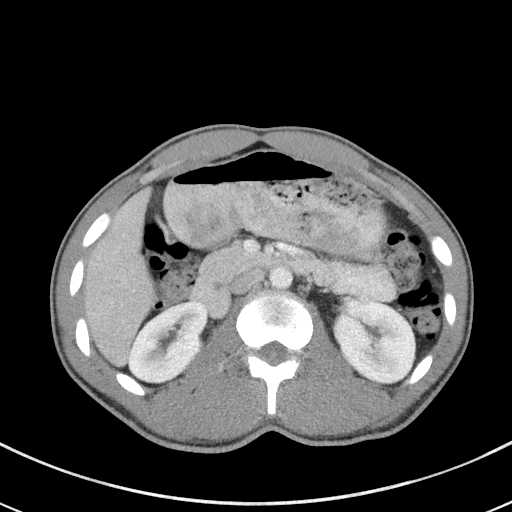
[im 71/89  soft-tissue]
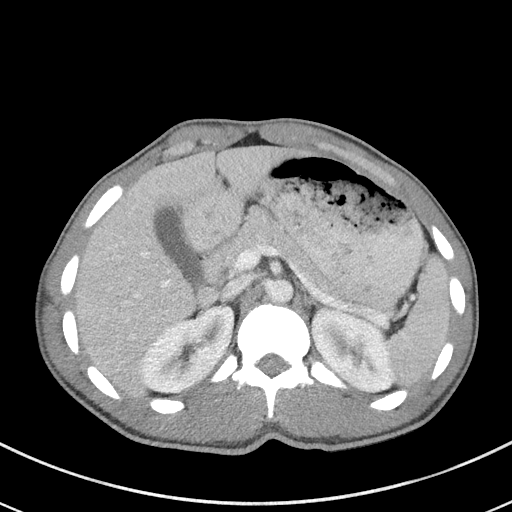
[im 78/89  soft-tissue]
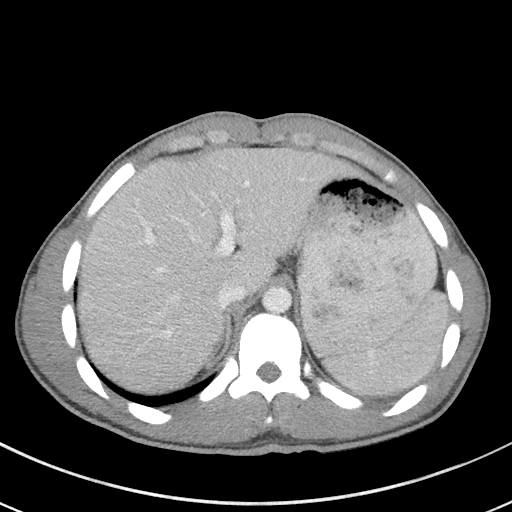
[im 85/89  soft-tissue]
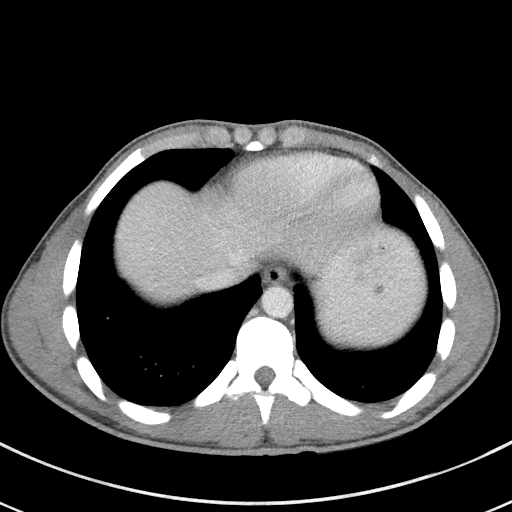

[Series 5: coronal st · coronal · 0.69mm/px · 3 of 68 slices shown]
[im 23/68  soft-tissue]
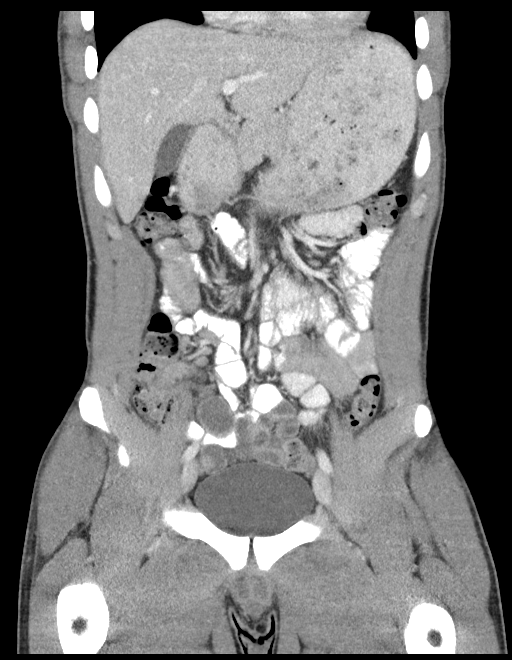
[im 30/68  soft-tissue]
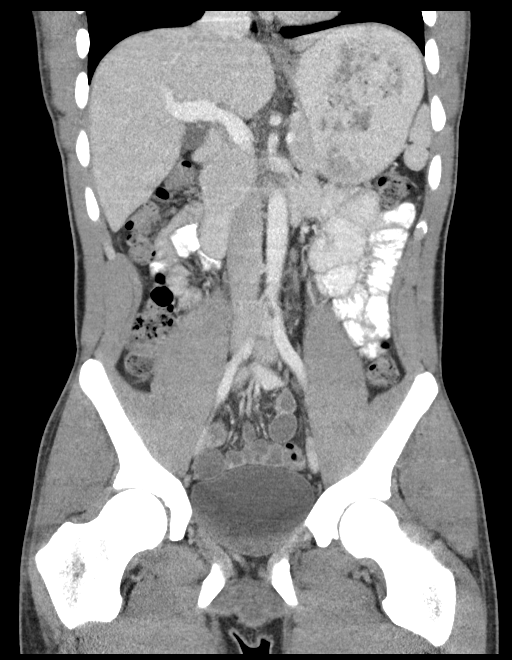
[im 38/68  soft-tissue]
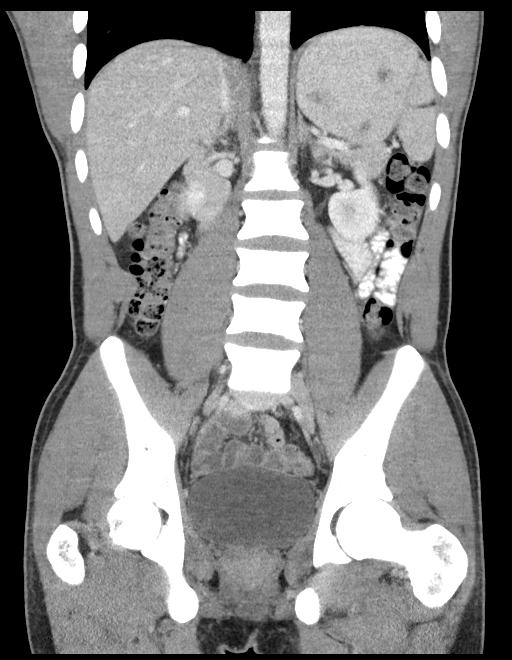

[16 of 46 positions shown; findings below may reference images not displayed]

FINDINGS: Lower chest: The lung bases are clear of acute process. No pleural
effusion or pulmonary lesions. The heart is normal in size. No
pericardial effusion. The distal esophagus and aorta are
unremarkable.

Hepatobiliary: No focal hepatic lesions or intrahepatic biliary
dilatation. The gallbladder is normal. No common bile duct
dilatation.

Pancreas: No mass, inflammation or ductal dilatation.

Spleen: Normal size.  No focal lesions.

Adrenals/Urinary Tract: The adrenal glands and kidneys are normal.
No renal, ureteral or bladder calculi or mass.

Stomach/Bowel: The stomach, duodenum, small bowel and colon are
unremarkable. No inflammatory changes, mass lesions or obstructive
findings. The terminal ileum is normal. The appendix is normal.

Vascular/Lymphatic: No significant vascular findings are present. No
enlarged abdominal or pelvic lymph nodes.

Reproductive: The prostate gland and seminal vesicles are normal.

Other: No pelvic mass or adenopathy. No free pelvic fluid
collections. No inguinal mass or adenopathy. No abdominal wall
hernia or subcutaneous lesions.

Musculoskeletal: Normal bony structures.
IMPRESSION: No acute abdominal/ pelvic findings, mass lesions or
lymphadenopathy.

## 2019-11-07 IMAGING — DX DG KNEE COMPLETE 4+V*R*
2 series · 2 of 2 positions shown · non-contrast
Comparison: None in PACs

CLINICAL DATA: Chronic bilateral knee pain.

EXAM:
RIGHT KNEE - COMPLETE 4+ VIEW; LEFT KNEE - COMPLETE 4+ VIEW

[knee ap]
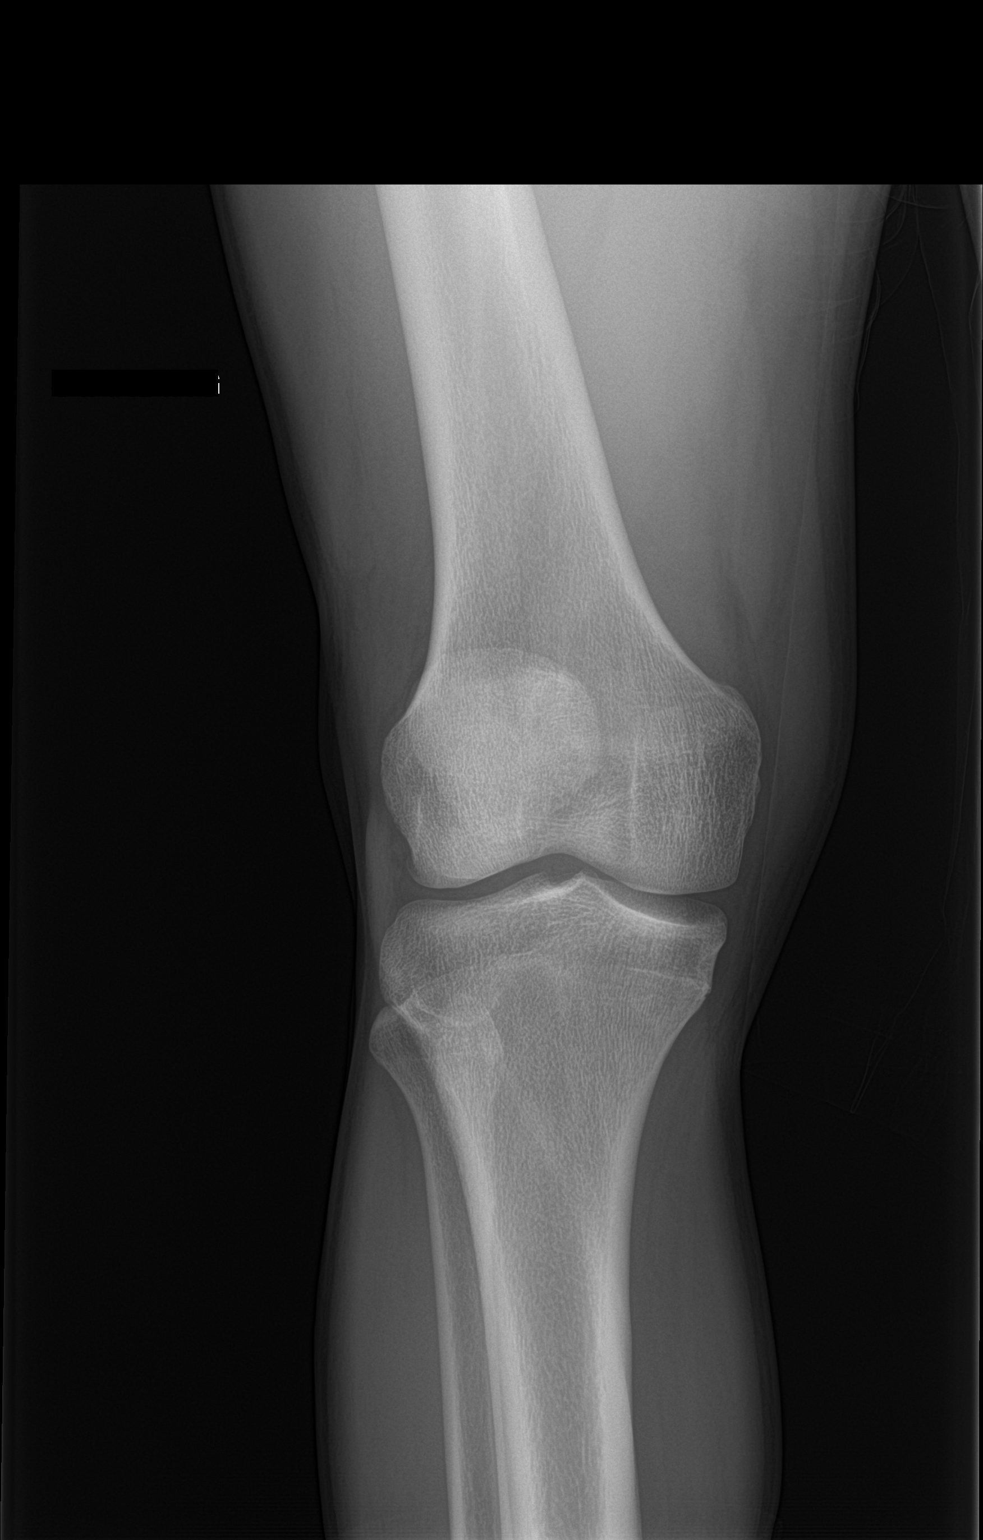

[knee tunnel]
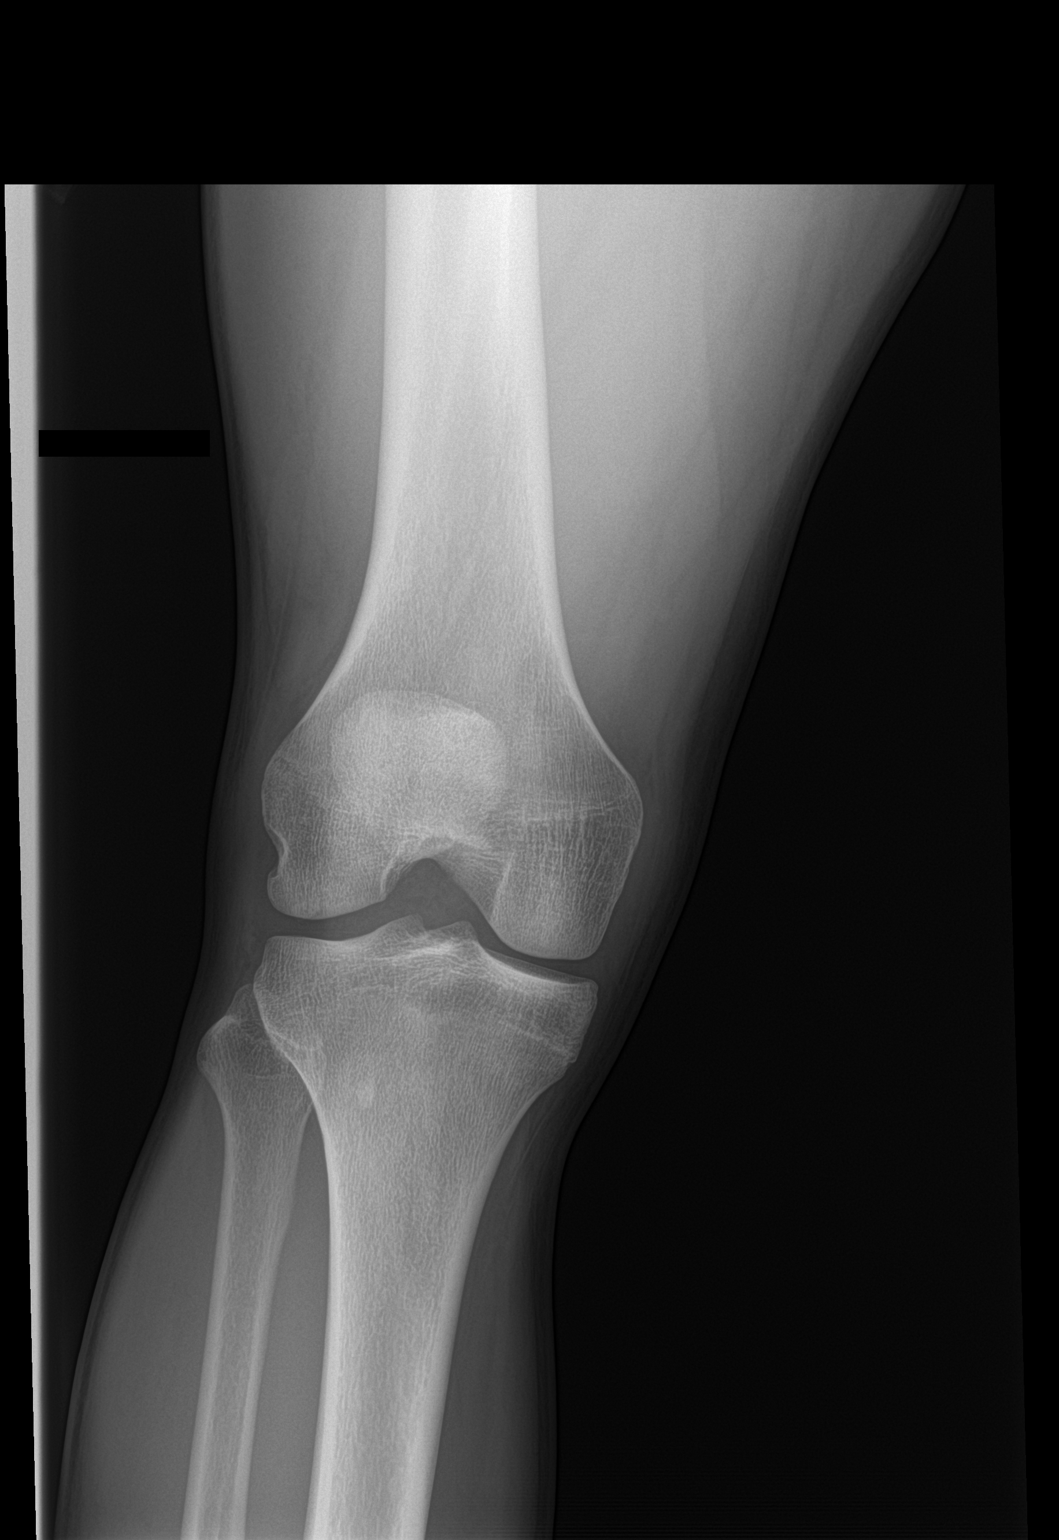

[2 of 2 positions shown; findings below may reference images not displayed]

FINDINGS: Right knee: The bones are subjectively adequately mineralized. The
joint spaces are well maintained. There is no acute or healing
fracture. No significant osteophyte formation is observed.

Left knee: The bones are subjectively adequately mineralized. The
joint spaces are well maintained. There is no acute fracture nor
dislocation.
IMPRESSION: There is no acute bony abnormality of either knee nor evidence of
significant degenerative change.
